# Patient Record
Sex: Female | Born: 1980
Health system: Southern US, Community
[De-identification: ages and names within clinical notes are randomized; demographics above are authoritative.]

## PROBLEM LIST (undated history)

## (undated) ENCOUNTER — Inpatient Hospital Stay (HOSPITAL_COMMUNITY): Payer: Self-pay

## (undated) DIAGNOSIS — R945 Abnormal results of liver function studies: Secondary | ICD-10-CM

## (undated) DIAGNOSIS — E7800 Pure hypercholesterolemia, unspecified: Secondary | ICD-10-CM

## (undated) DIAGNOSIS — R7989 Other specified abnormal findings of blood chemistry: Secondary | ICD-10-CM

---

## 2008-01-07 ENCOUNTER — Ambulatory Visit (HOSPITAL_COMMUNITY): Admission: RE | Admit: 2008-01-07 | Discharge: 2008-01-07 | Payer: Self-pay | Admitting: Family Medicine

## 2008-03-18 ENCOUNTER — Inpatient Hospital Stay (HOSPITAL_COMMUNITY): Admission: AD | Admit: 2008-03-18 | Discharge: 2008-03-18 | Payer: Self-pay | Admitting: Obstetrics & Gynecology

## 2008-03-18 ENCOUNTER — Ambulatory Visit: Payer: Self-pay | Admitting: Obstetrics and Gynecology

## 2008-03-22 ENCOUNTER — Inpatient Hospital Stay (HOSPITAL_COMMUNITY): Admission: AD | Admit: 2008-03-22 | Discharge: 2008-03-22 | Payer: Self-pay | Admitting: Obstetrics & Gynecology

## 2008-03-22 ENCOUNTER — Ambulatory Visit: Payer: Self-pay | Admitting: Obstetrics and Gynecology

## 2008-04-15 ENCOUNTER — Inpatient Hospital Stay (HOSPITAL_COMMUNITY): Admission: AD | Admit: 2008-04-15 | Discharge: 2008-04-19 | Payer: Self-pay | Admitting: Obstetrics & Gynecology

## 2008-04-15 ENCOUNTER — Ambulatory Visit: Payer: Self-pay | Admitting: Family

## 2008-11-17 ENCOUNTER — Encounter: Admission: RE | Admit: 2008-11-17 | Discharge: 2008-11-17 | Payer: Self-pay | Admitting: Internal Medicine

## 2010-07-17 LAB — CBC
HCT: 33.3 % — ABNORMAL LOW (ref 36.0–46.0)
MCHC: 34.3 g/dL (ref 30.0–36.0)
MCHC: 34.4 g/dL (ref 30.0–36.0)
MCV: 89.2 fL (ref 78.0–100.0)
Platelets: 223 10*3/uL (ref 150–400)
Platelets: 188 10*3/uL (ref 150–400)
RBC: 2.99 MIL/uL — ABNORMAL LOW (ref 3.87–5.11)
WBC: 11.3 10*3/uL — ABNORMAL HIGH (ref 4.0–10.5)
WBC: 18 10*3/uL — ABNORMAL HIGH (ref 4.0–10.5)

## 2011-12-06 ENCOUNTER — Other Ambulatory Visit (HOSPITAL_COMMUNITY)
Admission: RE | Admit: 2011-12-06 | Discharge: 2011-12-06 | Disposition: A | Discharge status: Home / Self Care | Payer: Commercial Indemnity | Source: Ambulatory Visit | Attending: Family Medicine | Admitting: Family Medicine

## 2011-12-06 DIAGNOSIS — Z124 Encounter for screening for malignant neoplasm of cervix: Secondary | ICD-10-CM | POA: Insufficient documentation

## 2011-12-06 DIAGNOSIS — Z1151 Encounter for screening for human papillomavirus (HPV): Secondary | ICD-10-CM | POA: Insufficient documentation

## 2011-12-06 DIAGNOSIS — R87810 Cervical high risk human papillomavirus (HPV) DNA test positive: Secondary | ICD-10-CM | POA: Insufficient documentation

## 2012-03-12 ENCOUNTER — Ambulatory Visit (INDEPENDENT_AMBULATORY_CARE_PROVIDER_SITE_OTHER): Payer: Managed Care, Other (non HMO) | Admitting: Physician Assistant

## 2012-03-12 VITALS — BP 134/76 | HR 93 | Temp 98.0°F | Resp 17 | Ht 63.0 in | Wt 177.0 lb

## 2012-03-12 DIAGNOSIS — N926 Irregular menstruation, unspecified: Secondary | ICD-10-CM

## 2012-03-12 LAB — POCT URINE PREGNANCY: Preg Test, Ur: POSITIVE

## 2012-03-12 NOTE — Patient Instructions (Signed)
LNMP 11/22/11 - estimated due date 08/28/12 Follow up with Obstetrician Continue prenatal vitamins Smoking cessation encouraged.

## 2012-03-12 NOTE — Progress Notes (Signed)
  Subjective:    Patient ID: Diamond Austin, female    DOB: [birth year], 31 y.o.   MRN: [id number]  HPI 31 year old female presents for pregnancy test. She needs this before she can be seen by the [facility].  LNMP 11/22/11. She did have some spotting at the beginning of September but states this was not her normal period.  Has had some lower abdominal pressure, but no pain, discharge, or gush of fluids.  Has had some nausea but no emesis.  This is her second pregnancy, her first child is 3 1/2.  She does continue to smoke, but is trying to quit.      Review of Systems  Constitutional: Negative for fever and chills.  Respiratory: Negative for cough.   Cardiovascular: Negative for chest pain.  Gastrointestinal: Positive for nausea and abdominal pain (lower, pressure). Negative for vomiting.  Genitourinary: Negative for dysuria, vaginal discharge and vaginal pain.  All other systems reviewed and are negative.       Objective:   Physical Exam  Constitutional: She is oriented to person, place, and time. She appears well-developed and well-nourished.  HENT:  Head: Normocephalic and atraumatic.  Right Ear: External ear normal.  Left Ear: External ear normal.  Eyes: Conjunctivae normal are normal.  Neck: Normal range of motion.  Cardiovascular: Normal rate.   Pulmonary/Chest: Effort normal.  Neurological: She is alert and oriented to person, place, and time.  Psychiatric: She has a normal mood and affect. Her behavior is normal. Judgment and thought content normal.      Results for orders placed in visit on 03/12/12  POCT URINE PREGNANCY      Component Value Range   Preg Test, Ur Positive         Assessment & Plan:   1. Menstrual irregularity  POCT urine pregnancy  Encouraged smoking cessation Follow up with obstetrics

## 2012-04-02 NOTE — L&D Delivery Note (Signed)
Delivery Note Pt pushed well and at 8:04 AM a viable female was delivered via Vaginal, Spontaneous Delivery (Presentation: OA  ).  APGAR: 7, 3; weight: pending.  Infant dried and placed on pt's abd initially; adequate vitals and crying. Shortly after was taken to warmer for eval and stimulation and a Code Apgar was called. Umbilical cord already drained of blood and placenta del by that time- unable to collect cord pH as initially was not indicated. Placenta status: Intact, Spontaneous.  Cord: 3 vessels   Anesthesia: Epidural  Episiotomy: None Lacerations: None Est. Blood Loss (mL): 200  Mom to postpartum.  Baby to nursery-stable.  Diamond Austin 08/20/2012, 8:26 AM

## 2012-06-09 ENCOUNTER — Inpatient Hospital Stay (HOSPITAL_COMMUNITY): Payer: Commercial Indemnity

## 2012-06-09 ENCOUNTER — Encounter (HOSPITAL_COMMUNITY): Payer: Self-pay | Admitting: *Deleted

## 2012-06-09 ENCOUNTER — Inpatient Hospital Stay (HOSPITAL_COMMUNITY)
Admission: AD | Admit: 2012-06-09 | Discharge: 2012-06-09 | Disposition: A | Discharge status: Home / Self Care | Payer: Commercial Indemnity | Source: Ambulatory Visit | Attending: Obstetrics & Gynecology | Admitting: Obstetrics & Gynecology

## 2012-06-09 DIAGNOSIS — R296 Repeated falls: Secondary | ICD-10-CM | POA: Insufficient documentation

## 2012-06-09 DIAGNOSIS — R109 Unspecified abdominal pain: Secondary | ICD-10-CM

## 2012-06-09 DIAGNOSIS — O99891 Other specified diseases and conditions complicating pregnancy: Secondary | ICD-10-CM

## 2012-06-09 DIAGNOSIS — O0930 Supervision of pregnancy with insufficient antenatal care, unspecified trimester: Secondary | ICD-10-CM | POA: Insufficient documentation

## 2012-06-09 DIAGNOSIS — O9A213 Injury, poisoning and certain other consequences of external causes complicating pregnancy, third trimester: Secondary | ICD-10-CM

## 2012-06-09 HISTORY — DX: Abnormal results of liver function studies: R94.5

## 2012-06-09 HISTORY — DX: Pure hypercholesterolemia, unspecified: E78.00

## 2012-06-09 HISTORY — DX: Other specified abnormal findings of blood chemistry: R79.89

## 2012-06-09 LAB — CBC
HCT: 31.4 % — ABNORMAL LOW (ref 36.0–46.0)
MCV: 83.5 fL (ref 78.0–100.0)
RBC: 3.76 MIL/uL — ABNORMAL LOW (ref 3.87–5.11)
WBC: 11.8 10*3/uL — ABNORMAL HIGH (ref 4.0–10.5)

## 2012-06-09 LAB — DIFFERENTIAL
Basophils Absolute: 0 10*3/uL (ref 0.0–0.1)
Eosinophils Absolute: 0.2 10*3/uL (ref 0.0–0.7)
Lymphs Abs: 2.1 10*3/uL (ref 0.7–4.0)
Neutro Abs: 8.6 10*3/uL — ABNORMAL HIGH (ref 1.7–7.7)
Neutrophils Relative %: 73 % (ref 43–77)

## 2012-06-09 LAB — TYPE AND SCREEN: Antibody Screen: NEGATIVE

## 2012-06-09 LAB — URINALYSIS, ROUTINE W REFLEX MICROSCOPIC
Bilirubin Urine: NEGATIVE
Glucose, UA: NEGATIVE mg/dL
Ketones, ur: NEGATIVE mg/dL
Nitrite: NEGATIVE
Urobilinogen, UA: 0.2 mg/dL (ref 0.0–1.0)

## 2012-06-09 NOTE — MAU Provider Note (Signed)
History     CSN: [id number]  Arrival date and time: 06/09/12 1531   None     No chief complaint on file.  HPI 32 y/o G2P1001 here after a fall 3 hours ago. States this afternoon she fell backwards out of a hammock onto her bottom. SHe began to have sharp upper and crampy lower abd pains about a 5/10 after that that have not changed since. She denies vaginal bleeding, LOF, discharge, irritation, and decreased FM. She denies fevers, chills, chest pain, dyspnea, and dysuria. She has some headaches normally which have not changed, she states they are worse with stress. She does not get any pre-natal care because of funding, she's been to [facility] urgent [facility] once, here once and is scheduled to go to the health dept next week.   OB History   Grav Para Term Preterm Abortions TAB SAB Ect Mult Living   2 1 1       1       Past Medical History  Diagnosis Date  . Hypercholesteremia   . Elevated liver function tests     2011    Past Surgical History  Procedure Laterality Date  . Vaginal delivery      X 1    Family History  Problem Relation Age of Onset  . Osteoporosis Mother   . Hypertension Father     History  Substance Use Topics  . Smoking status: Current Every Day Smoker -- 0.25 packs/day for 8 years    Types: Cigarettes  . Smokeless tobacco: Never Used  . Alcohol Use: No    Allergies: No Known Allergies  Prescriptions prior to admission  Medication Sig Dispense Refill  . acetaminophen (TYLENOL) 325 MG tablet Take 650 mg by mouth every 6 (six) hours as needed for pain (For headache.).      Marland Kitchen calcium carbonate (TUMS - DOSED IN MG ELEMENTAL CALCIUM) 500 MG chewable tablet Chew 1 tablet by mouth daily as needed for heartburn.      . Prenatal Vit-Fe Fumarate-FA (PRENATAL MULTIVITAMIN) TABS Take 1 tablet by mouth daily.        ROS Physical Exam   Blood pressure 92/48, pulse 81, temperature 98.6 F (37 C), temperature source Oral, resp. rate 18, height 5\' 3"  (1.6 m),  weight 85.73 kg (189 lb), last menstrual period 11/22/2011.  Physical Exam Gen: NAD, alert, cooperative with exam HEENT: NCAT, EOMI, PERRL CV: RRR, good S1/S2, no murmur Resp: CTABL, no wheezes, non-labored Abd: Soft nontender to palpation, pregnant abd Ext: No edema Neuro: Alert and oriented, No gross deficits  FHT: baseline 140, mod variability, accels present, no decels Toco: No contractions seen, some irritability  MAU Course  Procedures Results for orders placed during the hospital encounter of 06/09/12 (from the past 24 hour(s))  URINALYSIS, ROUTINE W REFLEX MICROSCOPIC     Status: Abnormal   Collection Time    06/09/12  3:50 PM      Result Value Range   Color, Urine YELLOW  YELLOW   APPearance CLEAR  CLEAR   Specific Gravity, Urine <1.005 (*) 1.005 - 1.030   pH 7.0  5.0 - 8.0   Glucose, UA NEGATIVE  NEGATIVE mg/dL   Hgb urine dipstick NEGATIVE  NEGATIVE   Bilirubin Urine NEGATIVE  NEGATIVE   Ketones, ur NEGATIVE  NEGATIVE mg/dL   Protein, ur NEGATIVE  NEGATIVE mg/dL   Urobilinogen, UA 0.2  0.0 - 1.0 mg/dL   Nitrite NEGATIVE  NEGATIVE   Leukocytes, UA NEGATIVE  NEGATIVE  CBC     Status: Abnormal   Collection Time    06/09/12  5:10 PM      Result Value Range   WBC 11.8 (*) 4.0 - 10.5 K/uL   RBC 3.76 (*) 3.87 - 5.11 MIL/uL   Hemoglobin 10.6 (*) 12.0 - 15.0 g/dL   HCT 16.1 (*) 09.6 - 04.5 %   MCV 83.5  78.0 - 100.0 fL   MCH 28.2  26.0 - 34.0 pg   MCHC 33.8  30.0 - 36.0 g/dL   RDW 40.9  81.1 - 91.4 %   Platelets 297  150 - 400 K/uL  DIFFERENTIAL     Status: Abnormal   Collection Time    06/09/12  5:10 PM      Result Value Range   Neutrophils Relative 73  43 - 77 %   Neutro Abs 8.6 (*) 1.7 - 7.7 K/uL   Lymphocytes Relative 18  12 - 46 %   Lymphs Abs 2.1  0.7 - 4.0 K/uL   Monocytes Relative 8  3 - 12 %   Monocytes Absolute 0.9  0.1 - 1.0 K/uL   Eosinophils Relative 1  0 - 5 %   Eosinophils Absolute 0.2  0.0 - 0.7 K/uL   Basophils Relative 0  0 - 1 %    Basophils Absolute 0.0  0.0 - 0.1 K/uL  TYPE AND SCREEN     Status: None   Collection Time    06/09/12  5:10 PM      Result Value Range   ABO/RH(D) A POS     Antibody Screen NEG     Sample Expiration 06/12/2012    RAPID HIV SCREEN ([facility])     Status: None   Collection Time    06/09/12  5:10 PM      Result Value Range   SUDS Rapid HIV Screen NON REACTIVE  NON REACTIVE    Assessment and Plan  32 y/o G2P1001 here after a fall - category 1 fetal strip with good variability - Limited US shows no placental abnormalities and 4 cm cervix - OB panel collected as she has no PNC, scheduled to go to [facility] next week - Trauma with no bad effect on fetus - DC home, f/u with HD as scheduled.   Kevin Fenton 06/09/2012, 7:50 PM

## 2012-06-09 NOTE — MAU Note (Signed)
Patient instructed to get dressed so she can be discharge. Patient left without signing discharge papers.

## 2012-06-09 NOTE — MAU Note (Addendum)
States she fell backwards out of a hammock around 1330. States she fell onto her backside. States she didn't think much about it, but about 2 hours later began to have some upper abdominal pressure and lower abdominal cramping. States is not constant, just "sporadic." States she has not received prenatal care. Was seen at a walk in clinic on [address] X 1. Had out-of pocket U/S in [place] at approx. 16 wks. ("Baby Bump").

## 2012-06-10 LAB — RPR: RPR Ser Ql: NONREACTIVE

## 2012-06-12 NOTE — MAU Provider Note (Signed)
I saw and examined patient along with student and agree with above note.   FRAZIER,NATALIE 06/12/2012 3:59 PM

## 2012-06-18 ENCOUNTER — Ambulatory Visit (HOSPITAL_COMMUNITY)
Admission: RE | Admit: 2012-06-18 | Discharge: 2012-06-18 | Disposition: A | Discharge status: Home / Self Care | Payer: Commercial Indemnity | Source: Ambulatory Visit | Attending: Obstetrics & Gynecology | Admitting: Obstetrics & Gynecology

## 2012-06-18 DIAGNOSIS — O99330 Smoking (tobacco) complicating pregnancy, unspecified trimester: Secondary | ICD-10-CM | POA: Insufficient documentation

## 2012-06-18 DIAGNOSIS — O358XX0 Maternal care for other (suspected) fetal abnormality and damage, not applicable or unspecified: Secondary | ICD-10-CM | POA: Insufficient documentation

## 2012-06-18 DIAGNOSIS — O9A213 Injury, poisoning and certain other consequences of external causes complicating pregnancy, third trimester: Secondary | ICD-10-CM

## 2012-06-18 DIAGNOSIS — Z1389 Encounter for screening for other disorder: Secondary | ICD-10-CM | POA: Insufficient documentation

## 2012-06-18 DIAGNOSIS — Z363 Encounter for antenatal screening for malformations: Secondary | ICD-10-CM | POA: Insufficient documentation

## 2012-07-29 ENCOUNTER — Encounter: Payer: Self-pay | Admitting: Obstetrics & Gynecology

## 2012-07-29 ENCOUNTER — Ambulatory Visit (INDEPENDENT_AMBULATORY_CARE_PROVIDER_SITE_OTHER): Payer: Commercial Indemnity | Admitting: Obstetrics & Gynecology

## 2012-07-29 ENCOUNTER — Other Ambulatory Visit: Payer: Self-pay | Admitting: Obstetrics & Gynecology

## 2012-07-29 VITALS — BP 112/77 | Temp 97.5°F | Wt 199.9 lb

## 2012-07-29 DIAGNOSIS — O0933 Supervision of pregnancy with insufficient antenatal care, third trimester: Secondary | ICD-10-CM

## 2012-07-29 DIAGNOSIS — O0930 Supervision of pregnancy with insufficient antenatal care, unspecified trimester: Secondary | ICD-10-CM

## 2012-07-29 LAB — POCT URINALYSIS DIP (DEVICE)
Hgb urine dipstick: NEGATIVE
Ketones, ur: NEGATIVE mg/dL
Leukocytes, UA: NEGATIVE
Nitrite: NEGATIVE
Protein, ur: NEGATIVE mg/dL
pH: 6 (ref 5.0–8.0)

## 2012-07-29 NOTE — Patient Instructions (Addendum)
Prenatal Care  WHAT IS PRENATAL CARE?  Prenatal care means health care during your pregnancy, before your baby is born. Take care of yourself and your baby by:   Getting early prenatal care. If you know you are pregnant, or think you might be pregnant, call your caregiver as soon as possible. Schedule a visit for a general/prenatal examination.  Getting regular prenatal care. Follow your caregiver's schedule for blood and other necessary tests. Do not miss appointments.  Do everything you can to keep yourself and your baby healthy during your pregnancy.  Prenatal care should include evaluation of medical, dietary, educational, psychological, and social needs for the couple and the medical, surgical, and genetic history of the family of the mother and father.  Discuss with your caregiver:  Your medicines, prescription, over-the-counter, and herbal medicines.  Substance abuse, alcohol, smoking, and illegal drugs.  Domestic abuse and violence, if present.  Your immunizations.  Nutrition and diet.  Exercising.  Environment and occupational hazards, at home and at work.  History of sexually transmitted disease, both you and your partner.  Previous pregnancies. WHY IS PRENATAL CARE SO IMPORTANT?  By seeing you regularly, your caregiver has the chance to find problems early, so that they can be treated as soon as possible. Other problems might be prevented. Many studies have shown that early and regular prenatal care is important for the health of both mothers and their babies.  I AM THINKING ABOUT GETTING PREGNANT. HOW CAN I TAKE CARE OF MYSELF?  Taking care of yourself before you get pregnant helps you to have a healthy pregnancy. It also lowers your chances of having a baby born with a birth defect. Here are ways to take care of yourself before you get pregnant:   Eat healthy foods, exercise regularly (30 minutes per day for most days of the week is best), and get enough rest and  sleep. Talk to your caregiver about what kinds of foods and exercises are best for you.  Take 400 micrograms (mcg) of folic acid (one of the B vitamins) every day. The best way to do this is to take a daily multivitamin pill that contains this amount of folic acid. Getting enough of the synthetic (manufactured) form of folic acid every day before you get pregnant and during early pregnancy can help prevent certain birth defects. Many breakfast cereals and other grain products have folic acid added to them, but only certain cereals contain 400 mcg of folic acid per serving. Check the label on your multivitamin or cereal to find the amount of folic acid in the food.  See your caregiver for a complete check up before getting pregnant. Make sure that you have had all your immunization shots, especially for rubella ([place] measles). Rubella can cause serious birth defects. Chickenpox is another illness you want to avoid during pregnancy. If you have had chickenpox and rubella in the past, you should be immune to them.  Tell your caregiver about any prescription or non-prescription medicines (including herbal remedies) you are taking. Some medicines are not safe to take during pregnancy.  Stop smoking cigarettes, drinking alcohol, or taking illegal drugs. Ask your caregiver for help, if you need it. You can also get help with alcohol and drugs by talking with a member of your faith community, a counselor, or a trusted friend.  Discuss and treat any medical, social, or psychological problems before getting pregnant.  Discuss any history of genetic problems in the mother, father, and their families. Do  genetic testing before getting pregnant, when possible.  Discuss any physical or emotional abuse with your caregiver.  Discuss with your caregiver if you might be exposed to harmful chemicals on your job or where you live.  Discuss with your caregiver if you think your job or the hours you work may be  harmful and should be changed.  The father should be involved with the decision making and with all aspects of the pregnancy, labor, and delivery.  If you have medical insurance, make sure you are covered for pregnancy. I JUST FOUND OUT THAT I AM PREGNANT. HOW CAN I TAKE CARE OF MYSELF?  Here are ways to take care of yourself and the precious new life growing inside you:   Continue taking your multivitamin with 400 micrograms (mcg) of folic acid every day.  Get early and regular prenatal care. It does not matter if this is your first pregnancy or if you already have children. It is very important to see a caregiver during your pregnancy. Your caregiver will check at each visit to make sure that you and the baby are healthy. If there are any problems, action can be taken right away to help you and the baby.  Eat a healthy diet that includes:  Fruits.  Vegetables.  Foods low in saturated fat.  Grains.  Calcium-rich foods.  Drink 6 to 8 glasses of liquids a day.  Unless your caregiver tells you not to, try to be physically active for 30 minutes, most days of the week. If you are pressed for time, you can get your activity in through 10 minute segments, three times a day.  If you smoke, drink alcohol, or use drugs, STOP. These can cause long-term damage to your baby. Talk with your caregiver about steps to take to stop smoking. Talk with a member of your faith community, a counselor, a trusted friend, or your caregiver if you are concerned about your alcohol or drug use.  Ask your caregiver before taking any medicine, even over-the-counter medicines. Some medicines are not safe to take during pregnancy.  Get plenty of rest and sleep.  Avoid hot tubs and saunas during pregnancy.  Do not have X-rays taken, unless absolutely necessary and with the recommendation of your caregiver. A lead shield can be placed on your abdomen, to protect the baby when X-rays are taken in other parts of the  body.  Do not empty the cat litter when you are pregnant. It may contain a parasite that causes an infection called toxoplasmosis, which can cause birth defects. Also, use gloves when working in garden areas used by cats.  Do not eat uncooked or undercooked cheese, meats, or fish.  Stay away from toxic chemicals like:  Insecticides.  Solvents (some cleaners or paint thinners).  Lead.  Mercury.  Sexual relations may continue until the end of the pregnancy, unless you have a medical problem or there is a problem with the pregnancy and your caregiver tells you not to.  Do not wear high heel shoes, especially during the second half of the pregnancy. You can lose your balance and fall.  Do not take long trips, unless absolutely necessary. Be sure to see your caregiver before going on the trip.  Do not sit in one position for more than 2 hours, when on a trip.  Take a copy of your medical records when going on a trip.  Know where there is a hospital in the city you are visiting, in case of an  emergency.  Most dangerous household products will have pregnancy warnings on their labels. Ask your caregiver about products if you are unsure.  Limit or eliminate your caffeine intake from coffee, tea, sodas, medicines, and chocolate.  Many women continue working through pregnancy. Staying active might help you stay healthier. If you have a question about the safety or the hours you work at your particular job, talk with your caregiver.  Get informed:  Read books.  Watch videos.  Go to childbirth classes for you and the father.  Talk with experienced moms.  Ask your caregiver about childbirth education classes for you and your partner. Classes can help you and your partner prepare for the birth of your baby.  Ask about a pediatrician (baby doctor) and methods and pain medicine for labor, delivery, and possible Cesarean delivery (C-section). I AM NOT THINKING ABOUT GETTING PREGNANT  RIGHT NOW, BUT HEARD THAT ALL WOMEN SHOULD TAKE FOLIC ACID EVERY DAY?  All women of childbearing age, with even a remote chance of getting pregnant, should try to make sure they get enough folic acid. Many pregnancies are not planned. Many women do not know they are actually pregnant early in their pregnancies, and certain birth defects happen in the very early part of pregnancy. Taking 400 micrograms (mcg) of folic acid every day will help prevent certain birth defects that happen in the early part of pregnancy. If a woman begins taking vitamin pills in the second or third month of pregnancy, it may be too late to prevent birth defects. Folic acid may also have other health benefits for women, besides preventing birth defects.  HOW OFTEN SHOULD I SEE MY CAREGIVER DURING PREGNANCY?  Your caregiver will give you a schedule for your prenatal visits. You will have visits more often as you get closer to the end of your pregnancy. An average pregnancy lasts about 40 weeks.  A typical schedule includes visiting your caregiver:   About once each month, during your first 6 months of pregnancy.  Every 2 weeks, during the next 2 months.  Weekly in the last month, until the delivery date. Your caregiver will probably want to see you more often if:  You are over 35.  Your pregnancy is high risk, because you have certain health problems or problems with the pregnancy, such as:  Diabetes.  High blood pressure.  The baby is not growing on schedule, according to the dates of the pregnancy. Your caregiver will do special tests, to make sure you and the baby are not having any serious problems. WHAT HAPPENS DURING PRENATAL VISITS?   At your first prenatal visit, your caregiver will talk to you about you and your partner's health history and your family's health history, and will do a physical exam.  On your first visit, a physical exam will include checks of your blood pressure, height and weight, and an  exam of your pelvic organs. Your caregiver will do a Pap test if you have not had one recently, and will do cultures of your cervix to make sure there is no infection.  At each visit, there will be tests of your blood, urine, blood pressure, weight, and checking the progress of the baby.  Your caregiver will be able to tell you when to expect that your baby will be born.  Each visit is also a chance for you to learn about staying healthy during pregnancy and for asking questions.  Discuss whether you will be breastfeeding.  At your later prenatal  visits, your caregiver will check how you are doing and how the baby is developing. You may have a number of tests done as your pregnancy progresses.  Ultrasound exams are often used to check on the baby's growth and health.  You may have more urine and blood tests, as well as special tests, if needed. These may include amniocentesis (examine fluid in the pregnancy sac), stress tests (check how baby responds to contractions), biophysical profile (measures fetus well-being). Your caregiver will explain the tests and why they are necessary. I AM IN MY LATE THIRTIES, AND I WANT TO HAVE A CHILD NOW. SHOULD I DO ANYTHING SPECIAL?  As you get older, there is more chance of having a medical problem (high blood pressure), pregnancy problem (preeclampsia, problems with the placenta), miscarriage, or a baby born with a birth defect. However, most women in their late thirties and early forties have healthy babies. See your caregiver on a regular basis before you get pregnant and be sure to go for exams throughout your pregnancy. Your caregiver probably will want to do some special tests to check on you and your baby's health when you are pregnant.  Women today are often delaying having children until later in life, when they are in their thirties and forties. While many women in their thirties and forties have no difficulty getting pregnant, fertility does decline  with age. For women over 40 who cannot get pregnant after 6 months of trying, it is recommended that they see their caregiver for a fertility evaluation. It is not uncommon to have trouble becoming pregnant or experience infertility (inability to become pregnant after trying for one year). If you think that you or your partner may be infertile, you can discuss this with your caregiver. He or she can recommend treatments such as drugs, surgery, or assisted reproductive technology.  Document Released: 03/22/2003 Document Revised: 06/11/2011 Document Reviewed: 02/16/2009 [facility] Patient Information 2013 [place], [place]. Glucose Tolerance Test During Pregnancy The glucose tolerance test (GTT) or 3-hour glucose test can be used to determine if a woman has diabetes that first begins or is first recognized during pregnancy (gestational diabetes). Typically, a GTT is done after you have had a 1-hour glucose test with results that indicate you possibly have gestational diabetes.  The test takes about 3 hours. There will be a series of blood tests after you drink the sugar water solution. You must remain at the testing location to make sure that your blood is drawn on time.  LET YOUR CAREGIVER KNOW ABOUT:  Allergies to food or medicine.  Medicines taken, including vitamins, herbs, eyedrops, over-the-counter medicines, and creams.  Any recent illnesses or infections. BEFORE THE PROCEDURE The GTT is a fasting test, meaning you must stop eating for a certain amount of time. The test will be the most accurate if you have not eaten for 8 12 hours before the test. For this reason, it is recommended that you have this test done in the morning before you have breakfast. PROCEDURE  Do not eat or drink anything but water during the test. When you arrive at the lab, a sample of your blood is taken to get your fasting blood glucose level. After your fasting glucose level is determined, you will be given a sugar water  solution to drink. You will be asked to wait in a certain area until your next blood test. The blood tests are done each hour for 3 hours. Stay close to the lab so your blood samples  can be taken on time. This is important. If the blood samples are not taken on time, the test will need to be done again on another day.  AFTER THE PROCEDURE  You can eat and drink as usual.   Ask when your test results will be ready. Make sure you get your test results. A positive test is considered when two of the four blood test values are equal or above the normal blood glucose level. Document Released: 09/18/2011 Document Reviewed: 07/12/2011 [facility] Patient Information 2013 [place], [place].

## 2012-07-29 NOTE — Progress Notes (Signed)
Patient c/o leg cramps. 

## 2012-07-30 DIAGNOSIS — O0930 Supervision of pregnancy with insufficient antenatal care, unspecified trimester: Secondary | ICD-10-CM | POA: Insufficient documentation

## 2012-07-30 LAB — OBSTETRIC PANEL
Basophils Absolute: 0 10*3/uL (ref 0.0–0.1)
Basophils Relative: 0 % (ref 0–1)
Hepatitis B Surface Ag: NEGATIVE
MCHC: 35.2 g/dL (ref 30.0–36.0)
Neutro Abs: 8.3 10*3/uL — ABNORMAL HIGH (ref 1.7–7.7)
Neutrophils Relative %: 75 % (ref 43–77)
Platelets: 294 10*3/uL (ref 150–400)
RDW: 14.6 % (ref 11.5–15.5)

## 2012-07-30 LAB — GLUCOSE TOLERANCE, 1 HOUR (50G) W/O FASTING: Glucose, 1 Hour GTT: 122 mg/dL (ref 70–140)

## 2012-07-31 LAB — HEMOGLOBINOPATHY EVALUATION
Hemoglobin Other: 0 %
Hgb A2 Quant: 3.1 % (ref 2.2–3.2)
Hgb F Quant: 0 % (ref 0.0–2.0)

## 2012-07-31 NOTE — Progress Notes (Signed)
Subjective:    Diamond Austin is being seen today for her first obstetrical visit.  This is a planned pregnancy. She is at [redacted]w[redacted]d gestation. Her obstetrical history is significant for smoker and stopped smokig during pregnancy.  Unsure if she plans to maintain.. Relationship with FOB: spouse, living together. Patient does intend to breast feed. Pregnancy history fully reviewed.  Menstrual History: OB History   Grav Para Term Preterm Abortions TAB SAB Ect Mult Living   2 1 1       1       Menarche age: 33  Patient's last menstrual period was 11/22/2011.    The following portions of the patient's history were reviewed and updated as appropriate: allergies, current medications, past family history, past medical history, past social history, past surgical history and problem list.  Review of Systems Pertinent items are noted in HPI.    Objective:    BP 112/77  Temp(Src) 97.5 F (36.4 C)  Wt 199 lb 14.4 oz (90.674 kg)  BMI 35.42 kg/m2  LMP 11/22/2011  General Appearance:    Alert, cooperative, no distress, appears stated age  Head:    Normocephalic, without obvious abnormality, atraumatic  Eyes:    PERRL, conjunctiva/corneas clear, EOM's intact, fundi    benign, both eyes  Ears:    Normal TM's and external ear canals, both ears  Nose:   Nares normal, septum midline, mucosa normal, no drainage    or sinus tenderness  Throat:   Lips, mucosa, and tongue normal; teeth and gums normal  Neck:   Supple, symmetrical, trachea midline, no adenopathy;    thyroid:  no enlargement/tenderness/nodules; no carotid   bruit or JVD  Back:     Symmetric, no curvature, ROM normal, no CVA tenderness  Lungs:     Clear to auscultation bilaterally, respirations unlabored  Chest Wall:    No tenderness or deformity   Heart:    Regular rate and rhythm, S1 and S2 normal, no murmur, rub   or gallop  Breast Exam:    No tenderness, masses, or nipple abnormality  Abdomen:     Soft, non-tender, bowel sounds  active all four quadrants,    no masses, no organomegaly  Genitalia:    Normal female without lesion, discharge or tenderness     Extremities:   Extremities normal, atraumatic, no cyanosis or edema  Pulses:   2+ and symmetric all extremities  Skin:   Skin color, texture, turgor normal, no rashes or lesions            Assessment:    Pregnancy at 36 and 5/7 weeks    Plan:    Initial labs drawn. Prenatal vitamins. Problem list reviewed and updated. AFP3 discussed: too late . Follow up in 1 weeks. F/u PAP and cx and GBS  1 hr GTT today

## 2012-08-04 ENCOUNTER — Ambulatory Visit (HOSPITAL_COMMUNITY): Payer: Commercial Indemnity

## 2012-08-05 ENCOUNTER — Ambulatory Visit (INDEPENDENT_AMBULATORY_CARE_PROVIDER_SITE_OTHER): Payer: Commercial Indemnity | Admitting: Obstetrics & Gynecology

## 2012-08-05 ENCOUNTER — Ambulatory Visit (HOSPITAL_COMMUNITY)
Admission: RE | Admit: 2012-08-05 | Discharge: 2012-08-05 | Disposition: A | Discharge status: Home / Self Care | Payer: Commercial Indemnity | Source: Ambulatory Visit | Attending: Obstetrics & Gynecology | Admitting: Obstetrics & Gynecology

## 2012-08-05 ENCOUNTER — Other Ambulatory Visit: Payer: Self-pay | Admitting: Obstetrics & Gynecology

## 2012-08-05 VITALS — BP 119/84 | Temp 97.9°F | Wt 199.0 lb

## 2012-08-05 DIAGNOSIS — O0933 Supervision of pregnancy with insufficient antenatal care, third trimester: Secondary | ICD-10-CM

## 2012-08-05 DIAGNOSIS — O0930 Supervision of pregnancy with insufficient antenatal care, unspecified trimester: Secondary | ICD-10-CM | POA: Insufficient documentation

## 2012-08-05 DIAGNOSIS — Z3689 Encounter for other specified antenatal screening: Secondary | ICD-10-CM | POA: Insufficient documentation

## 2012-08-05 DIAGNOSIS — O99330 Smoking (tobacco) complicating pregnancy, unspecified trimester: Secondary | ICD-10-CM | POA: Insufficient documentation

## 2012-08-05 LAB — POCT URINALYSIS DIP (DEVICE)
Leukocytes, UA: NEGATIVE
Protein, ur: NEGATIVE mg/dL
Specific Gravity, Urine: 1.02 (ref 1.005–1.030)
Urobilinogen, UA: 0.2 mg/dL (ref 0.0–1.0)
pH: 7 (ref 5.0–8.0)

## 2012-08-05 NOTE — Progress Notes (Signed)
[place] is today

## 2012-08-05 NOTE — Patient Instructions (Signed)
Normal Labor and Delivery  Your caregiver must first be sure you are in labor. Signs of labor include:  · You may pass what is called "the mucus plug" before labor begins. This is a small amount of blood stained mucus.  · Regular uterine contractions.  · The time between contractions get closer together.  · The discomfort and pain gradually gets more intense.  · Pains are mostly located in the back.  · Pains get worse when walking.  · The cervix (the opening of the uterus becomes thinner (begins to efface) and opens up (dilates).  Once you are in labor and admitted into the hospital or care center, your caregiver will do the following:  · A complete physical examination.  · Check your vital signs (blood pressure, pulse, temperature and the fetal heart rate).  · Do a vaginal examination (using a sterile glove and lubricant) to determine:  · The position (presentation) of the baby (head [vertex] or buttock first).  · The level (station) of the baby's head in the birth canal.  · The effacement and dilatation of the cervix.  · You may have your pubic hair shaved and be given an enema depending on your caregiver and the circumstance.  · An electronic monitor is usually placed on your abdomen. The monitor follows the length and intensity of the contractions, as well as the baby's heart rate.  · Usually, your caregiver will insert an IV in your arm with a bottle of sugar water. This is done as a precaution so that medications can be given to you quickly during labor or delivery.  NORMAL LABOR AND DELIVERY IS DIVIDED UP INTO 3 STAGES:  First Stage  This is when regular contractions begin and the cervix begins to efface and dilate. This stage can last from 3 to 15 hours. The end of the first stage is when the cervix is 100% effaced and 10 centimeters dilated. Pain medications may be given by   · Injection (morphine, demerol, etc.)  · Regional anesthesia (spinal, caudal or epidural, anesthetics given in different locations of  the spine). Paracervical pain medication may be given, which is an injection of and anesthetic on each side of the cervix.  A pregnant woman may request to have "Natural Childbirth" which is not to have any medications or anesthesia during her labor and delivery.  Second Stage  This is when the baby comes down through the birth canal (vagina) and is born. This can take 1 to 4 hours. As the baby's head comes down through the birth canal, you may feel like you are going to have a bowel movement. You will get the urge to bear down and push until the baby is delivered. As the baby's head is being delivered, the caregiver will decide if an episiotomy (a cut in the perineum and vagina area) is needed to prevent tearing of the tissue in this area. The episiotomy is sewn up after the delivery of the baby and placenta. Sometimes a mask with nitrous oxide is given for the mother to breath during the delivery of the baby to help if there is too much pain. The end of Stage 2 is when the baby is fully delivered. Then when the umbilical cord stops pulsating it is clamped and cut.  Third Stage  The third stage begins after the baby is completely delivered and ends after the placenta (afterbirth) is delivered. This usually takes 5 to 30 minutes. After the placenta is delivered, a medication   is given either by intravenous or injection to help contract the uterus and prevent bleeding. The third stage is not painful and pain medication is usually not necessary. If an episiotomy was done, it is repaired at this time.  After the delivery, the mother is watched and monitored closely for 1 to 2 hours to make sure there is no postpartum bleeding (hemorrhage). If there is a lot of bleeding, medication is given to contract the uterus and stop the bleeding.  Document Released: 12/27/2007 Document Revised: 06/11/2011 Document Reviewed: 12/27/2007  Exit[employer]® Patient Information ©2013 [employer].

## 2012-08-06 ENCOUNTER — Encounter: Payer: Self-pay | Admitting: Obstetrics & Gynecology

## 2012-08-12 ENCOUNTER — Ambulatory Visit (INDEPENDENT_AMBULATORY_CARE_PROVIDER_SITE_OTHER): Payer: Commercial Indemnity | Admitting: Obstetrics & Gynecology

## 2012-08-12 ENCOUNTER — Encounter: Payer: Self-pay | Admitting: Obstetrics & Gynecology

## 2012-08-12 VITALS — BP 128/79 | Temp 97.5°F | Wt 202.5 lb

## 2012-08-12 DIAGNOSIS — O0930 Supervision of pregnancy with insufficient antenatal care, unspecified trimester: Secondary | ICD-10-CM

## 2012-08-12 LAB — POCT URINALYSIS DIP (DEVICE)
Glucose, UA: NEGATIVE mg/dL
Ketones, ur: NEGATIVE mg/dL
Leukocytes, UA: NEGATIVE
Specific Gravity, Urine: <=1.005 (ref 1.005–1.030)
Urobilinogen, UA: 0.2 mg/dL (ref 0.0–1.0)

## 2012-08-12 NOTE — Patient Instructions (Signed)
Fetal Movement Counts Patient Name: __________________________________________________ Patient Due Date: ____________________ Kick counts is highly recommended in high risk pregnancies, but it is a good idea for every pregnant woman to do. Start counting fetal movements at 28 weeks of the pregnancy. Fetal movements increase after eating a full meal or eating or drinking something sweet (the blood sugar is higher). It is also important to drink plenty of fluids (well hydrated) before doing the count. Lie on your left side because it helps with the circulation or you can sit in a comfortable chair with your arms over your belly (abdomen) with no distractions around you. DOING THE COUNT  Try to do the count the same time of day each time you do it.  Mark the day and time, then see how long it takes for you to feel 10 movements (kicks, flutters, swishes, rolls). You should have at least 10 movements within 2 hours. You will most likely feel 10 movements in much less than 2 hours. If you do not, wait an hour and count again. After a couple of days you will see a pattern.  What you are looking for is a change in the pattern or not enough counts in 2 hours. Is it taking longer in time to reach 10 movements? SEEK MEDICAL CARE IF:  You feel less than 10 counts in 2 hours. Tried twice.  No movement in one hour.  The pattern is changing or taking longer each day to reach 10 counts in 2 hours.  You feel the baby is not moving as it usually does. Date: ____________ Movements: ____________ Start time: ____________ Finish time: ____________  Date: ____________ Movements: ____________ Start time: ____________ Finish time: ____________ Date: ____________ Movements: ____________ Start time: ____________ Finish time: ____________ Date: ____________ Movements: ____________ Start time: ____________ Finish time: ____________ Date: ____________ Movements: ____________ Start time: ____________ Finish time:  ____________ Date: ____________ Movements: ____________ Start time: ____________ Finish time: ____________ Date: ____________ Movements: ____________ Start time: ____________ Finish time: ____________ Date: ____________ Movements: ____________ Start time: ____________ Finish time: ____________  Date: ____________ Movements: ____________ Start time: ____________ Finish time: ____________ Date: ____________ Movements: ____________ Start time: ____________ Finish time: ____________ Date: ____________ Movements: ____________ Start time: ____________ Finish time: ____________ Date: ____________ Movements: ____________ Start time: ____________ Finish time: ____________ Date: ____________ Movements: ____________ Start time: ____________ Finish time: ____________ Date: ____________ Movements: ____________ Start time: ____________ Finish time: ____________ Date: ____________ Movements: ____________ Start time: ____________ Finish time: ____________  Date: ____________ Movements: ____________ Start time: ____________ Finish time: ____________ Date: ____________ Movements: ____________ Start time: ____________ Finish time: ____________ Date: ____________ Movements: ____________ Start time: ____________ Finish time: ____________ Date: ____________ Movements: ____________ Start time: ____________ Finish time: ____________ Date: ____________ Movements: ____________ Start time: ____________ Finish time: ____________ Date: ____________ Movements: ____________ Start time: ____________ Finish time: ____________ Date: ____________ Movements: ____________ Start time: ____________ Finish time: ____________  Date: ____________ Movements: ____________ Start time: ____________ Finish time: ____________ Date: ____________ Movements: ____________ Start time: ____________ Finish time: ____________ Date: ____________ Movements: ____________ Start time: ____________ Finish time: ____________ Date: ____________ Movements:  ____________ Start time: ____________ Finish time: ____________ Date: ____________ Movements: ____________ Start time: ____________ Finish time: ____________ Date: ____________ Movements: ____________ Start time: ____________ Finish time: ____________ Date: ____________ Movements: ____________ Start time: ____________ Finish time: ____________  Date: ____________ Movements: ____________ Start time: ____________ Finish time: ____________ Date: ____________ Movements: ____________ Start time: ____________ Finish time: ____________ Date: ____________ Movements: ____________ Start time: ____________ Finish time: ____________ Date: ____________ Movements:   ____________ Start time: ____________ Finish time: ____________ Date: ____________ Movements: ____________ Start time: ____________ Finish time: ____________ Date: ____________ Movements: ____________ Start time: ____________ Finish time: ____________ Date: ____________ Movements: ____________ Start time: ____________ Finish time: ____________  Date: ____________ Movements: ____________ Start time: ____________ Finish time: ____________ Date: ____________ Movements: ____________ Start time: ____________ Finish time: ____________ Date: ____________ Movements: ____________ Start time: ____________ Finish time: ____________ Date: ____________ Movements: ____________ Start time: ____________ Finish time: ____________ Date: ____________ Movements: ____________ Start time: ____________ Finish time: ____________ Date: ____________ Movements: ____________ Start time: ____________ Finish time: ____________ Date: ____________ Movements: ____________ Start time: ____________ Finish time: ____________  Date: ____________ Movements: ____________ Start time: ____________ Finish time: ____________ Date: ____________ Movements: ____________ Start time: ____________ Finish time: ____________ Date: ____________ Movements: ____________ Start time: ____________ Finish  time: ____________ Date: ____________ Movements: ____________ Start time: ____________ Finish time: ____________ Date: ____________ Movements: ____________ Start time: ____________ Finish time: ____________ Date: ____________ Movements: ____________ Start time: ____________ Finish time: ____________ Date: ____________ Movements: ____________ Start time: ____________ Finish time: ____________  Date: ____________ Movements: ____________ Start time: ____________ Finish time: ____________ Date: ____________ Movements: ____________ Start time: ____________ Finish time: ____________ Date: ____________ Movements: ____________ Start time: ____________ Finish time: ____________ Date: ____________ Movements: ____________ Start time: ____________ Finish time: ____________ Date: ____________ Movements: ____________ Start time: ____________ Finish time: ____________ Date: ____________ Movements: ____________ Start time: ____________ Finish time: ____________ Document Released: 04/18/2006 Document Revised: 06/11/2011 Document Reviewed: 10/19/2008 Exit[employer] Patient Information 2013 [employer].  

## 2012-08-12 NOTE — Progress Notes (Signed)
Pulse- 80  Edema-hands  Pain-"when I walk the weight of the belly"

## 2012-08-12 NOTE — Progress Notes (Signed)
Pt with no complaints.  Reviewed dating.  EDC 08/28/2012 based on sure LMP.  +FM, No VB, No ctx, No LOF

## 2012-08-19 ENCOUNTER — Ambulatory Visit (INDEPENDENT_AMBULATORY_CARE_PROVIDER_SITE_OTHER): Payer: Commercial Indemnity | Admitting: Obstetrics & Gynecology

## 2012-08-19 ENCOUNTER — Encounter (HOSPITAL_COMMUNITY): Payer: Self-pay | Admitting: Obstetrics and Gynecology

## 2012-08-19 ENCOUNTER — Encounter: Payer: Self-pay | Admitting: Obstetrics & Gynecology

## 2012-08-19 ENCOUNTER — Inpatient Hospital Stay (HOSPITAL_COMMUNITY)
Admission: AD | Admit: 2012-08-19 | Discharge: 2012-08-21 | DRG: 775 | Disposition: A | Discharge status: Home / Self Care | Payer: Managed Care, Other (non HMO) | Source: Ambulatory Visit | Attending: Family Medicine | Admitting: Family Medicine

## 2012-08-19 VITALS — BP 125/79 | Temp 97.7°F | Wt 205.1 lb

## 2012-08-19 DIAGNOSIS — O4290 Premature rupture of membranes, unspecified as to length of time between rupture and onset of labor, unspecified weeks of gestation: Principal | ICD-10-CM | POA: Diagnosis present

## 2012-08-19 DIAGNOSIS — O42013 Preterm premature rupture of membranes, onset of labor within 24 hours of rupture, third trimester: Secondary | ICD-10-CM | POA: Diagnosis present

## 2012-08-19 DIAGNOSIS — O0930 Supervision of pregnancy with insufficient antenatal care, unspecified trimester: Secondary | ICD-10-CM

## 2012-08-19 LAB — POCT URINALYSIS DIP (DEVICE)
Bilirubin Urine: NEGATIVE
Glucose, UA: NEGATIVE mg/dL
Hgb urine dipstick: NEGATIVE
Nitrite: NEGATIVE
Urobilinogen, UA: 0.2 mg/dL (ref 0.0–1.0)

## 2012-08-19 LAB — POCT FERN TEST

## 2012-08-19 LAB — CBC
MCH: 28.9 pg (ref 26.0–34.0)
MCHC: 34.5 g/dL (ref 30.0–36.0)
MCV: 84 fL (ref 78.0–100.0)
Platelets: 254 10*3/uL (ref 150–400)
RDW: 13.9 % (ref 11.5–15.5)

## 2012-08-19 LAB — TYPE AND SCREEN

## 2012-08-19 LAB — OB RESULTS CONSOLE GBS: GBS: NEGATIVE

## 2012-08-19 MED ORDER — IBUPROFEN 600 MG PO TABS: 600.0000 mg | ORAL_TABLET | Freq: Four times a day (QID) | ORAL | Status: DC | PRN

## 2012-08-19 MED ORDER — LIDOCAINE HCL (PF) 1 % IJ SOLN
30.0000 mL | INTRAMUSCULAR | Status: DC | PRN
  Filled 2012-08-19 (×2): qty 30

## 2012-08-19 MED ORDER — FLEET ENEMA 7-19 GM/118ML RE ENEM: 1.0000 | ENEMA | RECTAL | Status: DC | PRN

## 2012-08-19 MED ORDER — OXYTOCIN 40 UNITS IN LACTATED RINGERS INFUSION - SIMPLE MED
62.5000 mL/h | INTRAVENOUS | Status: DC
  Administered 2012-08-20: 62.5 mL/h via INTRAVENOUS

## 2012-08-19 MED ORDER — OXYCODONE-ACETAMINOPHEN 5-325 MG PO TABS: 1.0000 | ORAL_TABLET | ORAL | Status: DC | PRN

## 2012-08-19 MED ORDER — OXYTOCIN 40 UNITS IN LACTATED RINGERS INFUSION - SIMPLE MED
1.0000 m[IU]/min | INTRAVENOUS | Status: DC
  Administered 2012-08-19: 2 m[IU]/min via INTRAVENOUS
  Filled 2012-08-19: qty 1000

## 2012-08-19 MED ORDER — ZOLPIDEM TARTRATE 5 MG PO TABS: 5.0000 mg | ORAL_TABLET | Freq: Every evening | ORAL | Status: DC | PRN

## 2012-08-19 MED ORDER — LACTATED RINGERS IV SOLN: 500.0000 mL | INTRAVENOUS | Status: DC | PRN

## 2012-08-19 MED ORDER — LACTATED RINGERS IV SOLN
INTRAVENOUS | Status: DC
  Administered 2012-08-19 – 2012-08-20 (×2): via INTRAVENOUS

## 2012-08-19 MED ORDER — OXYTOCIN BOLUS FROM INFUSION: 500.0000 mL | INTRAVENOUS | Status: DC

## 2012-08-19 MED ORDER — CITRIC ACID-SODIUM CITRATE 334-500 MG/5ML PO SOLN: 30.0000 mL | ORAL | Status: DC | PRN

## 2012-08-19 MED ORDER — TERBUTALINE SULFATE 1 MG/ML IJ SOLN: 0.2500 mg | Freq: Once | INTRAMUSCULAR | Status: AC | PRN

## 2012-08-19 MED ORDER — ACETAMINOPHEN 325 MG PO TABS
650.0000 mg | ORAL_TABLET | ORAL | Status: DC | PRN
  Administered 2012-08-19: 650 mg via ORAL
  Filled 2012-08-19: qty 2

## 2012-08-19 MED ORDER — ONDANSETRON HCL 4 MG/2ML IJ SOLN: 4.0000 mg | Freq: Four times a day (QID) | INTRAMUSCULAR | Status: DC | PRN

## 2012-08-19 NOTE — Patient Instructions (Signed)
Preeclampsia and Eclampsia Preeclampsia is a condition of high blood pressure during pregnancy. It can happen at 20 weeks or later in pregnancy. If high blood pressure occurs in the second half of pregnancy with no other symptoms, it is called gestational hypertension and goes away after the baby is born. If any of the symptoms listed below develop with gestational hypertension, it is then called preeclampsia. Eclampsia (convulsions) may follow preeclampsia. This is one of the reasons for regular prenatal checkups. Early diagnosis and treatment are very important to prevent eclampsia. CAUSES  There is no known cause of preeclampsia/eclampsia in pregnancy. There are several known conditions that may put the pregnant woman at risk, such as:  The first pregnancy.  Having preeclampsia in a past pregnancy.  Having lasting (chronic) high blood pressure.  Having multiples (twins, triplets).  Being age 35 or older.  African American ethnic background.  Having kidney disease or diabetes.  Medical conditions such as lupus or blood diseases.  Being overweight (obese). SYMPTOMS   High blood pressure.  Headaches.  Sudden weight gain.  Swelling of hands, face, legs, and feet.  Protein in the urine.  Feeling sick to your stomach (nauseous) and throwing up (vomiting).  Vision problems (blurred or double vision).  Numbness in the face, arms, legs, and feet.  Dizziness.  Slurred speech.  Preeclampsia can cause growth retardation in the fetus.  Separation (abruption) of the placenta.  Not enough fluid in the amniotic sac (oligohydramnios).  Sensitivity to bright lights.  Belly (abdominal) pain. DIAGNOSIS  If protein is found in the urine in the second half of pregnancy, this is considered preeclampsia. Other symptoms mentioned above may also be present. TREATMENT  It is necessary to treat this.  Your caregiver may prescribe bed rest early in this condition. Plenty of rest and  salt restriction may be all that is needed.  Medicines may be necessary to lower blood pressure if the condition does not respond to more conservative measures.  In more severe cases, hospitalization may be needed:  For treatment of blood pressure.  To control fluid retention.  To monitor the baby to see if the condition is causing harm to the baby.  Hospitalization is the best way to treat the first sign of preeclampsia. This is so the mother and baby can be watched closely and blood tests can be done effectively and correctly.  If the condition becomes severe, it may be necessary to induce labor or to remove the infant by surgical means (cesarean section). The best cure for preeclampsia/eclampsia is to deliver the baby. Preeclampsia and eclampsia involve risks to mother and infant. Your caregiver will discuss these risks with you. Together, you can work out the best possible approach to your problems. Make sure you keep your prenatal visits as scheduled. Not keeping appointments could result in a chronic or permanent injury, pain, disability to you, and death or injury to you or your unborn baby. If there is any problem keeping the appointment, you must call to reschedule. HOME CARE INSTRUCTIONS   Keep your prenatal appointments and tests as scheduled.  Tell your caregiver if you have any of the above risk factors.  Get plenty of rest and sleep.  Eat a balanced diet that is low in salt, and do not add salt to your food.  Avoid stressful situations.  Only take over-the-counter and prescriptions medicines for pain, discomfort, or fever as directed by your caregiver. SEEK IMMEDIATE MEDICAL CARE IF:   You develop severe swelling   anywhere in the body. This usually occurs in the legs.  You gain 5 lb/2.3 kg or more in a week.  You develop a severe headache, dizziness, problems with your vision, or confusion.  You have abdominal pain, nausea, or vomiting.  You have a seizure.  You  have trouble moving any part of your body, or you develop numbness or problems speaking.  You have bruising or abnormal bleeding from anywhere in the body.  You develop a stiff neck.  You pass out. MAKE SURE YOU:   Understand these instructions.  Will watch your condition.  Will get help right away if you are not doing well or get worse. Document Released: 03/16/2000 Document Revised: 06/11/2011 Document Reviewed: 10/31/2007 Exit[employer] Patient Information 2013 [employer].  

## 2012-08-19 NOTE — H&P (Signed)
Diamond Austin is a 32 y.o. female presenting for ROM. Maternal Medical History:  Reason for admission: Rupture of membranes.  Nausea. ROM at 2 pm today.  No contractions yet.  Contractions: Onset was 3-5 hours ago.   Frequency: irregular.   Perceived severity is mild.    Fetal activity: Perceived fetal activity is normal.   Last perceived fetal movement was within the past hour.    Prenatal complications: No bleeding or preterm labor.   Prenatal Complications - Diabetes: none.    OB History   Grav Para Term Preterm Abortions TAB SAB Ect Mult Living   2 1 1       1      Past Medical History  Diagnosis Date  . Hypercholesteremia   . Elevated liver function tests     2011   Past Surgical History  Procedure Laterality Date  . Vaginal delivery      X 1   Family History: family history includes Arthritis in her paternal grandmother; Hypertension in her brother and father; and Osteoporosis in her mother. Social History:  reports that she has been smoking Cigarettes.  She has a 2 pack-year smoking history. She has never used smokeless tobacco. She reports that she does not drink alcohol or use illicit drugs.   Prenatal Transfer Tool  Maternal Diabetes: No Genetic Screening: Declined too late Maternal Ultrasounds/Referrals: Normal Fetal Ultrasounds or other Referrals:  None Maternal Substance Abuse:  No Significant Maternal Medications:  None Significant Maternal Lab Results:  Lab values include: Group B Strep negative Other Comments:  None  Review of Systems  Constitutional: Negative for fever.  Respiratory: Negative for cough and sputum production.   Cardiovascular: Negative for chest pain.  Gastrointestinal: Negative for nausea and vomiting.  Genitourinary: Negative for dysuria.  Skin: Negative for rash.  Neurological: Negative for headaches.    Dilation: 2 Effacement (%): 50 Station: -3 Exam by:: Dr. Shawnie Pons Blood pressure 128/86, pulse 120, temperature 98.8  F (37.1 C), temperature source Oral, resp. rate 18, last menstrual period 11/22/2011. Maternal Exam:  Uterine Assessment: Contraction strength is mild.  Contraction frequency is irregular.   Abdomen: Fetal presentation: vertex  Introitus: Normal vulva. Normal vagina.  Ferning test: not done.  Nitrazine test: not done. Amniotic fluid character: clear. Grossly ruptured  Pelvis: adequate for delivery.   Cervix: Cervix evaluated by digital exam.   2/50/-3  Physical Exam  Vitals reviewed. Constitutional: She is oriented to person, place, and time. She appears well-developed and well-nourished.  HENT:  Head: Normocephalic and atraumatic.  Eyes: No scleral icterus.  Neck: Neck supple.  Cardiovascular: Normal rate and regular rhythm.   Respiratory: Effort normal and breath sounds normal.  GI: Soft. There is no tenderness.  gravid  Musculoskeletal: She exhibits no edema.  Neurological: She is alert and oriented to person, place, and time.  Skin: Skin is warm and dry. No rash noted.  Psychiatric: She has a normal mood and affect.    Prenatal labs: ABO, Rh: A/POS/-- (04/29 0926) Antibody: NEG (04/29 0926) Rubella: 8.05 (04/29 0926) RPR: NON REAC (04/29 0926)  HBsAg: NEGATIVE (04/29 0926)  HIV: NON REACTIVE (04/29 0926)  GBS: Negative (05/20 0000)   Assessment/Plan: Patient Active Problem List   Diagnosis Date Noted  . Preterm PROM w/onset labor within 24 hours rupture in 3rd trimester 08/19/2012  . Late prenatal care complicating pregnancy 07/30/2012   Admit with labor augmentation   Diamond Austin 08/19/2012, 7:27 PM

## 2012-08-19 NOTE — MAU Note (Signed)
Diamond Austin is here with complaints of questionable ROM and no fetal movement since 2:00 this afternoon. She called the clinic and the nurse told her to seek medical attention. She is [redacted]w[redacted]d, G2P1

## 2012-08-19 NOTE — Progress Notes (Signed)
Bilateral LE edema.  Symmetric. No HA, visual changes or RUQ pain.

## 2012-08-19 NOTE — Progress Notes (Signed)
Pulse- 80  Edema-feet/hands  Pain-"cramping like a period"

## 2012-08-20 ENCOUNTER — Inpatient Hospital Stay (HOSPITAL_COMMUNITY): Payer: Managed Care, Other (non HMO) | Admitting: Anesthesiology

## 2012-08-20 ENCOUNTER — Encounter (HOSPITAL_COMMUNITY): Payer: Self-pay | Admitting: Anesthesiology

## 2012-08-20 ENCOUNTER — Encounter (HOSPITAL_COMMUNITY): Payer: Self-pay | Admitting: *Deleted

## 2012-08-20 DIAGNOSIS — O4290 Premature rupture of membranes, unspecified as to length of time between rupture and onset of labor, unspecified weeks of gestation: Secondary | ICD-10-CM

## 2012-08-20 MED ORDER — ONDANSETRON HCL 4 MG PO TABS: 4.0000 mg | ORAL_TABLET | ORAL | Status: DC | PRN

## 2012-08-20 MED ORDER — SIMETHICONE 80 MG PO CHEW: 80.0000 mg | CHEWABLE_TABLET | ORAL | Status: DC | PRN

## 2012-08-20 MED ORDER — ZOLPIDEM TARTRATE 5 MG PO TABS: 5.0000 mg | ORAL_TABLET | Freq: Every evening | ORAL | Status: DC | PRN

## 2012-08-20 MED ORDER — LANOLIN HYDROUS EX OINT: TOPICAL_OINTMENT | CUTANEOUS | Status: DC | PRN

## 2012-08-20 MED ORDER — BENZOCAINE-MENTHOL 20-0.5 % EX AERO
1.0000 "application " | INHALATION_SPRAY | CUTANEOUS | Status: DC | PRN
  Administered 2012-08-20: 1 via TOPICAL
  Filled 2012-08-20: qty 56

## 2012-08-20 MED ORDER — LIDOCAINE HCL (PF) 1 % IJ SOLN
INTRAMUSCULAR | Status: DC | PRN
  Administered 2012-08-20 (×2): 5 mL

## 2012-08-20 MED ORDER — PNEUMOCOCCAL VAC POLYVALENT 25 MCG/0.5ML IJ INJ
0.5000 mL | INJECTION | INTRAMUSCULAR | Status: AC
  Administered 2012-08-21: 0.5 mL via INTRAMUSCULAR
  Filled 2012-08-20 (×2): qty 0.5

## 2012-08-20 MED ORDER — IBUPROFEN 600 MG PO TABS
600.0000 mg | ORAL_TABLET | Freq: Four times a day (QID) | ORAL | Status: DC
  Administered 2012-08-20 – 2012-08-21 (×5): 600 mg via ORAL
  Filled 2012-08-20 (×5): qty 1

## 2012-08-20 MED ORDER — EPHEDRINE 5 MG/ML INJ
10.0000 mg | INTRAVENOUS | Status: DC | PRN
  Filled 2012-08-20: qty 2

## 2012-08-20 MED ORDER — DIPHENHYDRAMINE HCL 50 MG/ML IJ SOLN: 12.5000 mg | INTRAMUSCULAR | Status: DC | PRN

## 2012-08-20 MED ORDER — TETANUS-DIPHTH-ACELL PERTUSSIS 5-2.5-18.5 LF-MCG/0.5 IM SUSP
0.5000 mL | Freq: Once | INTRAMUSCULAR | Status: AC
  Administered 2012-08-21: 0.5 mL via INTRAMUSCULAR
  Filled 2012-08-20: qty 0.5

## 2012-08-20 MED ORDER — ONDANSETRON HCL 4 MG/2ML IJ SOLN: 4.0000 mg | INTRAMUSCULAR | Status: DC | PRN

## 2012-08-20 MED ORDER — DIPHENHYDRAMINE HCL 25 MG PO CAPS: 25.0000 mg | ORAL_CAPSULE | Freq: Four times a day (QID) | ORAL | Status: DC | PRN

## 2012-08-20 MED ORDER — DIBUCAINE 1 % RE OINT: 1.0000 "application " | TOPICAL_OINTMENT | RECTAL | Status: DC | PRN

## 2012-08-20 MED ORDER — FENTANYL 2.5 MCG/ML BUPIVACAINE 1/10 % EPIDURAL INFUSION (WH - ANES)
14.0000 mL/h | INTRAMUSCULAR | Status: DC | PRN
  Administered 2012-08-20: 14 mL/h via EPIDURAL
  Filled 2012-08-20: qty 125

## 2012-08-20 MED ORDER — OXYCODONE-ACETAMINOPHEN 5-325 MG PO TABS: 1.0000 | ORAL_TABLET | ORAL | Status: DC | PRN

## 2012-08-20 MED ORDER — PHENYLEPHRINE 40 MCG/ML (10ML) SYRINGE FOR IV PUSH (FOR BLOOD PRESSURE SUPPORT)
80.0000 ug | PREFILLED_SYRINGE | INTRAVENOUS | Status: DC | PRN
  Filled 2012-08-20: qty 2

## 2012-08-20 MED ORDER — PRENATAL MULTIVITAMIN CH
1.0000 | ORAL_TABLET | Freq: Every day | ORAL | Status: DC
  Administered 2012-08-20 – 2012-08-21 (×2): 1 via ORAL
  Filled 2012-08-20 (×2): qty 1

## 2012-08-20 MED ORDER — LACTATED RINGERS IV SOLN
500.0000 mL | Freq: Once | INTRAVENOUS | Status: AC
  Administered 2012-08-20: 500 mL via INTRAVENOUS

## 2012-08-20 MED ORDER — PHENYLEPHRINE 40 MCG/ML (10ML) SYRINGE FOR IV PUSH (FOR BLOOD PRESSURE SUPPORT)
80.0000 ug | PREFILLED_SYRINGE | INTRAVENOUS | Status: DC | PRN
  Filled 2012-08-20: qty 5
  Filled 2012-08-20: qty 2

## 2012-08-20 MED ORDER — WITCH HAZEL-GLYCERIN EX PADS: 1.0000 "application " | MEDICATED_PAD | CUTANEOUS | Status: DC | PRN

## 2012-08-20 MED ORDER — FENTANYL CITRATE 0.05 MG/ML IJ SOLN
100.0000 ug | INTRAMUSCULAR | Status: DC | PRN
  Administered 2012-08-20: 100 ug via INTRAVENOUS
  Filled 2012-08-20: qty 2

## 2012-08-20 MED ORDER — SENNOSIDES-DOCUSATE SODIUM 8.6-50 MG PO TABS
2.0000 | ORAL_TABLET | Freq: Every day | ORAL | Status: DC
  Administered 2012-08-20: 2 via ORAL

## 2012-08-20 MED ORDER — EPHEDRINE 5 MG/ML INJ
10.0000 mg | INTRAVENOUS | Status: DC | PRN
  Filled 2012-08-20: qty 2
  Filled 2012-08-20: qty 4

## 2012-08-20 NOTE — Progress Notes (Signed)
Shaw CNM updated on FHR, UC pattern, and pt request for IV pain medicine.  Clelia Croft to put in orders for pain medication.

## 2012-08-20 NOTE — Anesthesia Procedure Notes (Signed)
Epidural Patient location during procedure: OB Start time: 08/20/2012 5:37 AM  Staffing Anesthesiologist: Angus Seller., Harrell Gave. Performed by: anesthesiologist   Preanesthetic Checklist Completed: patient identified, site marked, surgical consent, pre-op evaluation, timeout performed, IV checked, risks and benefits discussed and monitors and equipment checked  Epidural Patient position: sitting Prep: site prepped and draped and DuraPrep Patient monitoring: continuous pulse ox and blood pressure Approach: midline Injection technique: LOR air and LOR saline  Needle:  Needle type: Tuohy  Needle gauge: 17 G Needle length: 9 cm and 9 Needle insertion depth: 5 cm cm Catheter type: closed end flexible Catheter size: 19 Gauge Catheter at skin depth: 10 cm Test dose: negative  Assessment Events: blood not aspirated, injection not painful, no injection resistance, negative IV test and no paresthesia  Additional Notes Patient identified.  Risk benefits discussed including failed block, incomplete pain control, headache, nerve damage, paralysis, blood pressure changes, nausea, vomiting, reactions to medication both toxic or allergic, and postpartum back pain.  Patient expressed understanding and wished to proceed.  All questions were answered.  Sterile technique used throughout procedure and epidural site dressed with sterile barrier dressing. No paresthesia or other complications noted.The patient did not experience any signs of intravascular injection such as tinnitus or metallic taste in mouth nor signs of intrathecal spread such as rapid motor block. Please see nursing notes for vital signs.

## 2012-08-20 NOTE — Anesthesia Preprocedure Evaluation (Signed)
Anesthesia Evaluation  Patient identified by MRN, date of birth, ID band Patient awake    Reviewed: Allergy & Precautions, H&P , Patient's Chart, lab work & pertinent test results  Airway Mallampati: III TM Distance: >3 FB Neck ROM: full    Dental no notable dental hx.    Pulmonary neg pulmonary ROS,  breath sounds clear to auscultation  Pulmonary exam normal       Cardiovascular negative cardio ROS  Rhythm:regular Rate:Normal     Neuro/Psych negative neurological ROS  negative psych ROS   GI/Hepatic negative GI ROS, Neg liver ROS,   Endo/Other  negative endocrine ROS  Renal/GU negative Renal ROS     Musculoskeletal   Abdominal   Peds  Hematology negative hematology ROS (+)   Anesthesia Other Findings   Reproductive/Obstetrics (+) Pregnancy                           Anesthesia Physical Anesthesia Plan  ASA: II  Anesthesia Plan: Epidural   Post-op Pain Management:    Induction:   Airway Management Planned:   Additional Equipment:   Intra-op Plan:   Post-operative Plan:   Informed Consent: I have reviewed the patients History and Physical, chart, labs and discussed the procedure including the risks, benefits and alternatives for the proposed anesthesia with the patient or authorized representative who has indicated his/her understanding and acceptance.     Plan Discussed with:   Anesthesia Plan Comments:         Anesthesia Quick Evaluation  

## 2012-08-20 NOTE — Progress Notes (Signed)
Shaw CNM updated on SVE, FHR and pt feeling pressure.  Orders given to labor down and call for delivery.

## 2012-08-20 NOTE — Anesthesia Postprocedure Evaluation (Signed)
Anesthesia Post Note  Patient: [name] @[facility]   Procedure(s) Performed: CLE/C/S  Anesthesia type: Epidural  Patient location: Mother/Baby  Post pain: Pain level controlled  Post assessment: Post-op Vital signs reviewed  Last Vitals: BP 123/68  Pulse 69  Temp(Src) 37.1 C (Oral)  Resp 18  Ht 5\' 3"  (1.6 m)  Wt 205 lb (92.987 kg)  BMI 36.32 kg/m2  SpO2 99%  LMP 11/22/2011  Post vital signs: Reviewed  Level of consciousness: awake  Complications: No apparent anesthesia complications

## 2012-08-20 NOTE — Progress Notes (Signed)
Clelia Croft CNM updated on pt request for epidural.  Orders given to place order for epidural on maternal request.

## 2012-08-21 MED ORDER — IBUPROFEN 600 MG PO TABS: 600.0000 mg | ORAL_TABLET | Freq: Four times a day (QID) | ORAL | Status: DC

## 2012-08-21 NOTE — Discharge Summary (Signed)
Attestation of Attending Supervision of Advanced Practitioner (PA/CNM/NP): Evaluation and management procedures were performed by the Advanced Practitioner under my supervision and collaboration.  I have reviewed the Advanced Practitioner's note and chart, and I agree with the management and plan.  Annye Forrey, MD, FACOG Attending Obstetrician & Gynecologist Faculty Practice, [facility]  

## 2012-08-21 NOTE — Discharge Summary (Signed)
Obstetric Discharge Summary Reason for Admission: onset of labor Prenatal Procedures: none Intrapartum Procedures: spontaneous vaginal delivery Postpartum Procedures: none Complications-Operative and Postpartum: none  Diamond Austin is a 32 y.o. female [id number] who presented to the MAU with PROM at [redacted]w[redacted]d.  She denies dizziness upon standing.  She has been tolerating food and drink and has been making urine, bowel movements, and passing flatus regularly.  She plans to breast feed exclusively.  She is planning to start birth control, but has not yet made up her mind as to which method she will choose.  She plans to see the [facility] for her postpartum OBGYN needs.  She has an appointment with [facility] to discuss circumcision for her son.    Hemoglobin  Date Value Range Status  08/19/2012 12.1  12.0 - 15.0 g/dL Final     HCT  Date Value Range Status  08/19/2012 35.1* 36.0 - 46.0 % Final    Physical Exam:  General: alert, cooperative and no distress CVS: RRR, good S1 and S2, no RGM Lungs: CTA bilaterally, no wheeze, rales, or rhonchi ABD: Normoactive bowel sounds x4 quadrants, soft, non-tender, non-distended Lochia: appropriate Uterine Fundus: firm DVT Evaluation: No evidence of DVT seen on physical exam. Negative Homan's sign.  Discharge Diagnoses: Term Pregnancy-delivered  Discharge Information: Date: 08/21/2012 Activity: pelvic rest Diet: routine Medications: PNV, Ibuprofen, Colace and Iron Condition: stable Instructions: refer to practice specific booklet Discharge to: home   Newborn Data: Live born female  Birth Weight: 5 lb 14.2 oz (2670 g) APGAR: 7, 3  Home with mother.  Anna Genre 08/21/2012, 8:49 AM  Seen also by me Agree with note Wynelle Bourgeois CNM

## 2012-08-22 NOTE — Progress Notes (Signed)
Pt discharged before CSW could assess reason for LPNC @ 35 weeks. CSW will monitor drug screen & make a report if necessary.      

## 2012-08-26 ENCOUNTER — Encounter: Payer: Commercial Indemnity | Admitting: Obstetrics & Gynecology

## 2012-09-24 ENCOUNTER — Ambulatory Visit: Payer: Commercial Indemnity | Admitting: Advanced Practice Midwife

## 2012-09-26 ENCOUNTER — Ambulatory Visit (INDEPENDENT_AMBULATORY_CARE_PROVIDER_SITE_OTHER): Payer: Managed Care, Other (non HMO) | Admitting: Obstetrics and Gynecology

## 2012-09-26 ENCOUNTER — Encounter: Payer: Self-pay | Admitting: Obstetrics and Gynecology

## 2012-09-26 NOTE — Progress Notes (Signed)
  Subjective:     Diamond Austin is a 32 y.o. female who presents for a postpartum visit. She is 5 weeks postpartum following a spontaneous vaginal delivery. I have fully reviewed the prenatal and intrapartum course. The delivery was at 38.6 gestational weeks. Outcome: spontaneous vaginal delivery. Anesthesia: epidural. Postpartum course has been uncomplicated. Baby's course has been uncomplicaetd. Baby is feeding by breast. Bleeding no bleeding. Bowel function is normal. Bladder function is normal. Patient is not sexually active. Contraception method is none. Postpartum depression screening: negative.     Review of Systems A comprehensive review of systems was negative.   Objective:    BP 118/76  Pulse 72  Ht 5\' 3"  (1.6 m)  Wt 188 lb 6.4 oz (85.458 kg)  BMI 33.38 kg/m2  Breastfeeding? Yes  General:  alert, cooperative and no distress   Breasts:  inspection negative, no nipple discharge or bleeding, no masses or nodularity palpable  Lungs: clear to auscultation bilaterally  Heart:  regular rate and rhythm  Abdomen: soft, non-tender; bowel sounds normal; no masses,  no organomegaly   Vulva:  normal  Vagina: normal vagina, no discharge, exudate, lesion, or erythema  Cervix:  multiparous appearance  Corpus: normal size, contour, position, consistency, mobility, non-tender  Adnexa:  normal adnexa and no mass, fullness, tenderness  Rectal Exam: Not performed.        Assessment:     Normal postpartum exam. Pap smear not done at today's visit.   Plan:    1. Contraception: IUD Patient medically cleared to resume all activities of daily living and exercise program 2. RTC for annual in April 2015 3. Follow up in: a few days for IUD insertion or as needed.

## 2012-10-27 ENCOUNTER — Ambulatory Visit: Payer: Managed Care, Other (non HMO) | Admitting: Obstetrics and Gynecology

## 2014-02-01 ENCOUNTER — Encounter: Payer: Self-pay | Admitting: Obstetrics and Gynecology

## 2014-03-23 ENCOUNTER — Other Ambulatory Visit (HOSPITAL_COMMUNITY)
Admission: RE | Admit: 2014-03-23 | Discharge: 2014-03-23 | Disposition: A | Discharge status: Home / Self Care | Payer: BC Managed Care – PPO | Source: Ambulatory Visit | Attending: Family Medicine | Admitting: Family Medicine

## 2014-03-23 ENCOUNTER — Other Ambulatory Visit: Payer: Self-pay | Admitting: Family Medicine

## 2014-03-23 DIAGNOSIS — Z1151 Encounter for screening for human papillomavirus (HPV): Secondary | ICD-10-CM | POA: Diagnosis present

## 2014-03-23 DIAGNOSIS — Z124 Encounter for screening for malignant neoplasm of cervix: Secondary | ICD-10-CM | POA: Insufficient documentation

## 2014-03-24 LAB — CYTOLOGY - PAP

## 2014-09-21 LAB — OB RESULTS CONSOLE HIV ANTIBODY (ROUTINE TESTING): HIV: NONREACTIVE

## 2014-09-21 LAB — OB RESULTS CONSOLE HGB/HCT, BLOOD
HCT: 36 %
HEMOGLOBIN: 11.7 g/dL

## 2014-09-21 LAB — OB RESULTS CONSOLE PLATELET COUNT: PLATELETS: 315 10*3/uL

## 2014-09-21 LAB — OB RESULTS CONSOLE RPR: RPR: NONREACTIVE

## 2014-09-23 LAB — OB RESULTS CONSOLE GC/CHLAMYDIA
CHLAMYDIA, DNA PROBE: NEGATIVE
Gonorrhea: NEGATIVE

## 2014-10-27 ENCOUNTER — Encounter (HOSPITAL_COMMUNITY): Payer: Self-pay

## 2014-10-27 ENCOUNTER — Ambulatory Visit (HOSPITAL_COMMUNITY): Payer: BLUE CROSS/BLUE SHIELD

## 2014-10-27 ENCOUNTER — Ambulatory Visit (HOSPITAL_COMMUNITY)
Admission: RE | Admit: 2014-10-27 | Discharge: 2014-10-27 | Disposition: A | Discharge status: Home / Self Care | Payer: BLUE CROSS/BLUE SHIELD | Source: Ambulatory Visit | Attending: Physician Assistant | Admitting: Physician Assistant

## 2014-10-27 ENCOUNTER — Other Ambulatory Visit (HOSPITAL_COMMUNITY): Payer: Self-pay | Admitting: Physician Assistant

## 2014-10-27 DIAGNOSIS — Z3A19 19 weeks gestation of pregnancy: Secondary | ICD-10-CM | POA: Insufficient documentation

## 2014-10-27 DIAGNOSIS — O09529 Supervision of elderly multigravida, unspecified trimester: Secondary | ICD-10-CM | POA: Insufficient documentation

## 2014-10-27 DIAGNOSIS — Z3A00 Weeks of gestation of pregnancy not specified: Secondary | ICD-10-CM | POA: Diagnosis not present

## 2014-10-27 DIAGNOSIS — O09522 Supervision of elderly multigravida, second trimester: Secondary | ICD-10-CM | POA: Diagnosis present

## 2014-11-10 ENCOUNTER — Other Ambulatory Visit (HOSPITAL_COMMUNITY): Payer: Self-pay | Admitting: Physician Assistant

## 2014-11-10 DIAGNOSIS — Z3A19 19 weeks gestation of pregnancy: Secondary | ICD-10-CM

## 2014-11-10 DIAGNOSIS — O99212 Obesity complicating pregnancy, second trimester: Secondary | ICD-10-CM

## 2014-11-10 DIAGNOSIS — Z0489 Encounter for examination and observation for other specified reasons: Secondary | ICD-10-CM

## 2014-11-10 DIAGNOSIS — O09522 Supervision of elderly multigravida, second trimester: Secondary | ICD-10-CM

## 2014-11-10 DIAGNOSIS — IMO0002 Reserved for concepts with insufficient information to code with codable children: Secondary | ICD-10-CM

## 2014-11-24 ENCOUNTER — Ambulatory Visit (HOSPITAL_COMMUNITY)
Admission: RE | Admit: 2014-11-24 | Discharge: 2014-11-24 | Disposition: A | Discharge status: Home / Self Care | Payer: BLUE CROSS/BLUE SHIELD | Source: Ambulatory Visit | Attending: Physician Assistant | Admitting: Physician Assistant

## 2014-11-24 ENCOUNTER — Encounter (HOSPITAL_COMMUNITY): Payer: Self-pay

## 2014-11-24 ENCOUNTER — Other Ambulatory Visit (HOSPITAL_COMMUNITY): Payer: Self-pay | Admitting: *Deleted

## 2014-11-24 DIAGNOSIS — O4440 Low lying placenta NOS or without hemorrhage, unspecified trimester: Secondary | ICD-10-CM

## 2014-11-24 DIAGNOSIS — Z0489 Encounter for examination and observation for other specified reasons: Secondary | ICD-10-CM

## 2014-11-24 DIAGNOSIS — IMO0002 Reserved for concepts with insufficient information to code with codable children: Secondary | ICD-10-CM

## 2014-11-24 DIAGNOSIS — Z36 Encounter for antenatal screening of mother: Secondary | ICD-10-CM | POA: Insufficient documentation

## 2014-11-24 DIAGNOSIS — O09522 Supervision of elderly multigravida, second trimester: Secondary | ICD-10-CM

## 2014-11-24 DIAGNOSIS — O4402 Placenta previa specified as without hemorrhage, second trimester: Secondary | ICD-10-CM | POA: Insufficient documentation

## 2014-11-24 DIAGNOSIS — Z3A19 19 weeks gestation of pregnancy: Secondary | ICD-10-CM | POA: Insufficient documentation

## 2014-11-24 DIAGNOSIS — O99212 Obesity complicating pregnancy, second trimester: Secondary | ICD-10-CM | POA: Diagnosis not present

## 2014-11-24 NOTE — Progress Notes (Signed)
Appointment Date: 11/24/2014 DOB: April 26, 1980 Referring Provider: Kathyrn Lass, MD Attending: Dr. Jamey Reas  [name] and her husband, Diamond Austin, were seen for genetic counseling because of a maternal age of 34 y.o..     In summary:  Discussed AMA risks and reduction in risk from normal Quad and normal ultrasound  Offered additional screening (NIPS) and testing (amnio) - patient declined  They were counseled regarding maternal age and the association with risk for chromosome conditions due to nondisjunction with aging of the ova.   We reviewed chromosomes, nondisjunction, and the associated 1 in 141 risk for fetal aneuploidy related to a maternal age of 34 y.o. at [redacted]w[redacted]d gestation.  They were counseled that the risk for aneuploidy decreases as gestational age increases, accounting for those pregnancies which spontaneously abort.  We specifically discussed Down syndrome (trisomy 57), trisomies 59 and 56, and sex chromosome aneuploidies (47,XXX and 47,XXY) including the common features and prognoses of each.   We also reviewed the results of Diamond Austin's Quad screen and ultrasound.  We discussed that the Quad screen is able to adjust a person's baseline (age related) chance for specific chromosome conditions.  Based on this test, her chance for Down syndrome was reduced from her age related chance of 1 in 250 to 1 in 46; the Quad screen also reduced the chance for Trisomy 17 from her age related chance to less than 1 in 10000.   They were counseled that 50-80% of fetuses with Down syndrome and up to 90% of fetuses with trisomies 20 and 18, when well visualized, have detectable anomalies or soft markers by ultrasound.  We discussed that her ultrasound did not identify any fetal anomalies or soft markers of aneuploidy.    We reviewed the option of noninvasive prenatal screening (NIPS)/cell free DNA (cfDNA) testing.  They were counseled about the benefits and limitations of this option.  Specifically, we discussed the conditions for which the test screens, the detection rates, and false positive rates for each. They were also counseled regarding diagnostic testing via amniocentesis. We reviewed the approximate 1 in 220-254 risk for complications for amniocentesis, including spontaneous pregnancy loss.   After reviewing these results and the available options they expressed that they are not interested in pursuing diagnostic testing at this time or in the future, given the associated risk of complications.  They understand that ultrasound and Quad screen cannot rule out all birth defects or genetic syndromes.    Diamond Austin provided with written information regarding sickle cell anemia (SCA) including the carrier frequency and incidence in the Hispanic population, the availability of carrier testing and prenatal diagnosis if indicated.  In addition, we discussed that hemoglobinopathies are routinely screened for as part of the [place] newborn screening panel.  She declined hemoglobin electrophoresis today.  Both family histories were reviewed and found to be noncontributory for birth defects, intellectual disability, and known genetic conditions. Without further information regarding the provided family history, an accurate genetic risk cannot be calculated. Further genetic counseling is warranted if more information is obtained.  Diamond Austin denied exposure to environmental toxins or chemical agents. She denied the use of alcohol or street drugs.  She reports smoking about 6 cigarettes per day, she was encouraged in smoking cessation. She denied significant viral illnesses during the course of her pregnancy. Her medical and surgical histories were noncontributory.   I counseled this couple regarding the above risks and available options.  The approximate face-to-face time with the genetic counselor  was 40 minutes.  Cam Hai, MS,  Certified Genetic Counselor

## 2015-01-14 ENCOUNTER — Ambulatory Visit (HOSPITAL_COMMUNITY)
Admission: RE | Admit: 2015-01-14 | Discharge: 2015-01-14 | Disposition: A | Discharge status: Home / Self Care | Payer: BLUE CROSS/BLUE SHIELD | Source: Ambulatory Visit | Attending: Obstetrics and Gynecology | Admitting: Obstetrics and Gynecology

## 2015-01-14 ENCOUNTER — Other Ambulatory Visit (HOSPITAL_COMMUNITY): Payer: Self-pay | Admitting: Obstetrics and Gynecology

## 2015-01-14 DIAGNOSIS — Z36 Encounter for antenatal screening of mother: Secondary | ICD-10-CM | POA: Diagnosis not present

## 2015-01-14 DIAGNOSIS — IMO0002 Reserved for concepts with insufficient information to code with codable children: Secondary | ICD-10-CM

## 2015-01-14 DIAGNOSIS — O4440 Low lying placenta NOS or without hemorrhage, unspecified trimester: Secondary | ICD-10-CM | POA: Diagnosis not present

## 2015-01-14 DIAGNOSIS — Z0489 Encounter for examination and observation for other specified reasons: Secondary | ICD-10-CM

## 2015-02-03 ENCOUNTER — Ambulatory Visit (HOSPITAL_COMMUNITY): Payer: BLUE CROSS/BLUE SHIELD

## 2015-02-22 ENCOUNTER — Encounter (HOSPITAL_COMMUNITY): Payer: Self-pay | Admitting: *Deleted

## 2015-02-22 ENCOUNTER — Inpatient Hospital Stay (HOSPITAL_COMMUNITY)
Admission: AD | Admit: 2015-02-22 | Discharge: 2015-02-22 | Disposition: A | Discharge status: Home / Self Care | Payer: BLUE CROSS/BLUE SHIELD | Source: Ambulatory Visit | Attending: Obstetrics & Gynecology | Admitting: Obstetrics & Gynecology

## 2015-02-22 DIAGNOSIS — Z3A32 32 weeks gestation of pregnancy: Secondary | ICD-10-CM | POA: Diagnosis not present

## 2015-02-22 DIAGNOSIS — O09523 Supervision of elderly multigravida, third trimester: Secondary | ICD-10-CM

## 2015-02-22 DIAGNOSIS — W501XXA Accidental kick by another person, initial encounter: Secondary | ICD-10-CM | POA: Diagnosis not present

## 2015-02-22 DIAGNOSIS — O26893 Other specified pregnancy related conditions, third trimester: Secondary | ICD-10-CM | POA: Diagnosis present

## 2015-02-22 DIAGNOSIS — S3991XA Unspecified injury of abdomen, initial encounter: Secondary | ICD-10-CM
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
## 2015-02-22 NOTE — Discharge Instructions (Signed)
Vaginal Delivery During delivery, your health care provider will help you give birth to your baby. During a vaginal delivery, you will work to push the baby out of your vagina. However, before you can push your baby out, a few things need to happen. The opening of your uterus (cervix) has to soften, thin out, and open up (dilate) all the way to 10 cm. Also, your baby has to move down from the uterus into your vagina.  SIGNS OF LABOR  Your health care provider will first need to make sure you are in labor. Signs of labor include:   Passing what is called the mucous plug before labor begins. This is a small amount of blood-stained mucus.  Having regular, painful uterine contractions.   The time between contractions gets shorter.   The discomfort and pain gradually get more intense.  Contraction pains get worse when walking and do not go away when resting.   Your cervix becomes thinner (effacement) and dilates. BEFORE THE DELIVERY Once you are in labor and admitted into the hospital or care center, your health care provider may do the following:  1. Perform a complete physical exam. 2. Review any complications related to pregnancy or labor. 3. Check your blood pressure, pulse, temperature, and heart rate (vital signs).  4. Determine if, and when, the rupture of amniotic membranes occurred. 5. Do a vaginal exam (using a sterile glove and lubricant) to determine:  1. The position (presentation) of the baby. Is the baby's head presenting first (vertex) in the birth canal (vagina), or are the feet or buttocks first (breech)?  2. The level (station) of the baby's head within the birth canal.  3. The effacement and dilatation of the cervix.  6. An electronic fetal monitor is usually placed on your abdomen when you first arrive. This is used to monitor your contractions and the baby's heart rate. 1. When the monitor is on your abdomen (external fetal monitor), it can only pick up the  frequency and length of your contractions. It cannot tell the strength of your contractions. 2. If it becomes necessary for your health care provider to know exactly how strong your contractions are or to see exactly what the baby's heart rate is doing, an internal monitor may be inserted into your vagina and uterus. Your health care provider will discuss the benefits and risks of using an internal monitor and obtain your permission before inserting the device. 3. Continuous fetal monitoring may be needed if you have an epidural, are receiving certain medicines (such as oxytocin), or have pregnancy or labor complications. 7. An IV access tube may be placed into a vein in your arm to deliver fluids and medicines if necessary. THREE STAGES OF LABOR AND DELIVERY Normal labor and delivery is divided into three stages. First Stage This stage starts when you begin to contract regularly and your cervix begins to efface and dilate. It ends when your cervix is completely open (fully dilated). The first stage is the longest stage of labor and can last from 3 hours to 15 hours.  Several methods are available to help with labor pain. You and your health care provider will decide which option is best for you. Options include:   Opioid medicines. These are strong pain medicines that you can get through your IV tube or as a shot into your muscle. These medicines lessen pain but do not make it go away completely.  Epidural. A medicine is given through a thin tube  that is inserted in your back. The medicine numbs the lower part of your body and prevents any pain in that area.  Paracervical pain medicine. This is an injection of an anesthetic on each side of your cervix.   You may request natural childbirth, which does not involve the use of pain medicines or an epidural during labor and delivery. Instead, you will use other things, such as breathing exercises, to help cope with the pain. Second Stage The second  stage of labor begins when your cervix is fully dilated at 10 cm. It continues until you push your baby down through the birth canal and the baby is born. This stage can take only minutes or several hours.  The location of your baby's head as it moves through the birth canal is reported as a number called a station. If the baby's head has not started its descent, the station is described as being at minus 3 (-3). When your baby's head is at the zero station, it is at the middle of the birth canal and is engaged in the pelvis. The station of your baby helps indicate the progress of the second stage of labor.  When your baby is born, your health care provider may hold the baby with his or her head lowered to prevent amniotic fluid, mucus, and blood from getting into the baby's lungs. The baby's mouth and nose may be suctioned with a small bulb syringe to remove any additional fluid.  Your health care provider may then place the baby on your stomach. It is important to keep the baby from getting cold. To do this, the health care provider will dry the baby off, place the baby directly on your skin (with no blankets between you and the baby), and cover the baby with warm, dry blankets.   The umbilical cord is cut. Third Stage During the third stage of labor, your health care provider will deliver the placenta (afterbirth) and make sure your bleeding is under control. The delivery of the placenta usually takes about 5 minutes but can take up to 30 minutes. After the placenta is delivered, a medicine may be given either by IV or injection to help contract the uterus and control bleeding. If you are planning to breastfeed, you can try to do so now. After you deliver the placenta, your uterus should contract and get very firm. If your uterus does not remain firm, your health care provider will massage it. This is important because the contraction of the uterus helps cut off bleeding at the site where the placenta  was attached to your uterus. If your uterus does not contract properly and stay firm, you may continue to bleed heavily. If there is a lot of bleeding, medicines may be given to contract the uterus and stop the bleeding.    This information is not intended to replace advice given to you by your health care provider. Make sure you discuss any questions you have with your health care provider.   Document Released: 12/27/2007 Document Revised: 04/09/2014 Document Reviewed: 11/14/2011 Elsevier Interactive Patient Education 2016 [place]. Fetal Movement Counts Patient Name: __________________________________________________ Patient Due Date: ____________________ Performing a fetal movement count is highly recommended in high-risk pregnancies, but it is good for every pregnant woman to do. Your health care provider may ask you to start counting fetal movements at 28 weeks of the pregnancy. Fetal movements often increase:  After eating a full meal.  After physical activity.  After eating or  drinking something sweet or cold.  At rest. Pay attention to when you feel the baby is most active. This will help you notice a pattern of your baby's sleep and wake cycles and what factors contribute to an increase in fetal movement. It is important to perform a fetal movement count at the same time each day when your baby is normally most active.  HOW TO COUNT FETAL MOVEMENTS 8. Find a quiet and comfortable area to sit or lie down on your left side. Lying on your left side provides the best blood and oxygen circulation to your baby. 9. Write down the day and time on a sheet of paper or in a journal. 10. Start counting kicks, flutters, swishes, rolls, or jabs in a 2-hour period. You should feel at least 10 movements within 2 hours. 11. If you do not feel 10 movements in 2 hours, wait 2-3 hours and count again. Look for a change in the pattern or not enough counts in 2 hours. SEEK MEDICAL CARE IF:  You feel  less than 10 counts in 2 hours, tried twice.  There is no movement in over an hour.  The pattern is changing or taking longer each day to reach 10 counts in 2 hours.  You feel the baby is not moving as he or she usually does. Date: ____________ Movements: ____________ Start time: ____________ Elizebeth Koller time: ____________  Date: ____________ Movements: ____________ Start time: ____________ Elizebeth Koller time: ____________ Date: ____________ Movements: ____________ Start time: ____________ Elizebeth Koller time: ____________ Date: ____________ Movements: ____________ Start time: ____________ Elizebeth Koller time: ____________ Date: ____________ Movements: ____________ Start time: ____________ Elizebeth Koller time: ____________ Date: ____________ Movements: ____________ Start time: ____________ Elizebeth Koller time: ____________ Date: ____________ Movements: ____________ Start time: ____________ Elizebeth Koller time: ____________ Date: ____________ Movements: ____________ Start time: ____________ Elizebeth Koller time: ____________  Date: ____________ Movements: ____________ Start time: ____________ Elizebeth Koller time: ____________ Date: ____________ Movements: ____________ Start time: ____________ Elizebeth Koller time: ____________ Date: ____________ Movements: ____________ Start time: ____________ Elizebeth Koller time: ____________ Date: ____________ Movements: ____________ Start time: ____________ Elizebeth Koller time: ____________ Date: ____________ Movements: ____________ Start time: ____________ Elizebeth Koller time: ____________ Date: ____________ Movements: ____________ Start time: ____________ Elizebeth Koller time: ____________ Date: ____________ Movements: ____________ Start time: ____________ Elizebeth Koller time: ____________  Date: ____________ Movements: ____________ Start time: ____________ Elizebeth Koller time: ____________ Date: ____________ Movements: ____________ Start time: ____________ Elizebeth Koller time: ____________ Date: ____________ Movements: ____________ Start time: ____________ Elizebeth Koller time:  ____________ Date: ____________ Movements: ____________ Start time: ____________ Elizebeth Koller time: ____________ Date: ____________ Movements: ____________ Start time: ____________ Elizebeth Koller time: ____________ Date: ____________ Movements: ____________ Start time: ____________ Elizebeth Koller time: ____________ Date: ____________ Movements: ____________ Start time: ____________ Elizebeth Koller time: ____________  Date: ____________ Movements: ____________ Start time: ____________ Elizebeth Koller time: ____________ Date: ____________ Movements: ____________ Start time: ____________ Elizebeth Koller time: ____________ Date: ____________ Movements: ____________ Start time: ____________ Elizebeth Koller time: ____________ Date: ____________ Movements: ____________ Start time: ____________ Elizebeth Koller time: ____________ Date: ____________ Movements: ____________ Start time: ____________ Elizebeth Koller time: ____________ Date: ____________ Movements: ____________ Start time: ____________ Elizebeth Koller time: ____________ Date: ____________ Movements: ____________ Start time: ____________ Elizebeth Koller time: ____________  Date: ____________ Movements: ____________ Start time: ____________ Elizebeth Koller time: ____________ Date: ____________ Movements: ____________ Start time: ____________ Elizebeth Koller time: ____________ Date: ____________ Movements: ____________ Start time: ____________ Elizebeth Koller time: ____________ Date: ____________ Movements: ____________ Start time: ____________ Elizebeth Koller time: ____________ Date: ____________ Movements: ____________ Start time: ____________ Elizebeth Koller time: ____________ Date: ____________ Movements: ____________ Start time: ____________ Elizebeth Koller time: ____________ Date: ____________ Movements: ____________ Start time: ____________ Elizebeth Koller time: ____________  Date: ____________ Movements: ____________  Start time: ____________ Elizebeth Koller time: ____________ Date: ____________ Movements: ____________ Start time: ____________ Elizebeth Koller time: ____________ Date: ____________ Movements:  ____________ Start time: ____________ Elizebeth Koller time: ____________ Date: ____________ Movements: ____________ Start time: ____________ Elizebeth Koller time: ____________ Date: ____________ Movements: ____________ Start time: ____________ Elizebeth Koller time: ____________ Date: ____________ Movements: ____________ Start time: ____________ Elizebeth Koller time: ____________ Date: ____________ Movements: ____________ Start time: ____________ Elizebeth Koller time: ____________  Date: ____________ Movements: ____________ Start time: ____________ Elizebeth Koller time: ____________ Date: ____________ Movements: ____________ Start time: ____________ Elizebeth Koller time: ____________ Date: ____________ Movements: ____________ Start time: ____________ Elizebeth Koller time: ____________ Date: ____________ Movements: ____________ Start time: ____________ Elizebeth Koller time: ____________ Date: ____________ Movements: ____________ Start time: ____________ Elizebeth Koller time: ____________ Date: ____________ Movements: ____________ Start time: ____________ Elizebeth Koller time: ____________ Date: ____________ Movements: ____________ Start time: ____________ Elizebeth Koller time: ____________  Date: ____________ Movements: ____________ Start time: ____________ Elizebeth Koller time: ____________ Date: ____________ Movements: ____________ Start time: ____________ Elizebeth Koller time: ____________ Date: ____________ Movements: ____________ Start time: ____________ Elizebeth Koller time: ____________ Date: ____________ Movements: ____________ Start time: ____________ Elizebeth Koller time: ____________ Date: ____________ Movements: ____________ Start time: ____________ Elizebeth Koller time: ____________ Date: ____________ Movements: ____________ Start time: ____________ Elizebeth Koller time: ____________   This information is not intended to replace advice given to you by your health care provider. Make sure you discuss any questions you have with your health care provider.   Document Released: 04/18/2006 Document Revised: 04/09/2014 Document Reviewed:  01/14/2012 Elsevier Interactive Patient Education [employer].

## 2015-02-22 NOTE — MAU Note (Signed)
Urine in lab 

## 2015-03-23 LAB — OB RESULTS CONSOLE GBS: GBS: NEGATIVE

## 2015-03-24 ENCOUNTER — Telehealth: Payer: Self-pay

## 2015-03-24 ENCOUNTER — Other Ambulatory Visit: Payer: BLUE CROSS/BLUE SHIELD | Admitting: *Deleted

## 2015-03-24 ENCOUNTER — Encounter (HOSPITAL_COMMUNITY): Payer: Self-pay

## 2015-03-24 ENCOUNTER — Ambulatory Visit (HOSPITAL_COMMUNITY)
Admission: RE | Admit: 2015-03-24 | Discharge: 2015-03-24 | Disposition: A | Discharge status: Home / Self Care | Payer: BLUE CROSS/BLUE SHIELD | Source: Ambulatory Visit | Attending: Nurse Practitioner | Admitting: Nurse Practitioner

## 2015-03-24 ENCOUNTER — Other Ambulatory Visit (HOSPITAL_COMMUNITY): Payer: Self-pay | Admitting: Nurse Practitioner

## 2015-03-24 VITALS — BP 127/69 | HR 107

## 2015-03-24 DIAGNOSIS — O26843 Uterine size-date discrepancy, third trimester: Secondary | ICD-10-CM

## 2015-03-24 DIAGNOSIS — O99333 Smoking (tobacco) complicating pregnancy, third trimester: Secondary | ICD-10-CM | POA: Diagnosis not present

## 2015-03-24 DIAGNOSIS — O99213 Obesity complicating pregnancy, third trimester: Secondary | ICD-10-CM | POA: Insufficient documentation

## 2015-03-24 DIAGNOSIS — Z3A26 26 weeks gestation of pregnancy: Secondary | ICD-10-CM | POA: Insufficient documentation

## 2015-03-24 DIAGNOSIS — O09523 Supervision of elderly multigravida, third trimester: Secondary | ICD-10-CM | POA: Diagnosis present

## 2015-03-24 DIAGNOSIS — O133 Gestational [pregnancy-induced] hypertension without significant proteinuria, third trimester: Secondary | ICD-10-CM

## 2015-03-24 LAB — COMPREHENSIVE METABOLIC PANEL
ALBUMIN: 3.3 g/dL — AB (ref 3.6–5.1)
ALK PHOS: 185 U/L — AB (ref 33–115)
ALT: 30 U/L — AB (ref 6–29)
AST: 32 U/L — AB (ref 10–30)
BUN: 6 mg/dL — AB (ref 7–25)
CALCIUM: 9.3 mg/dL (ref 8.6–10.2)
CO2: 21 mmol/L (ref 20–31)
Chloride: 104 mmol/L (ref 98–110)
Creat: 0.59 mg/dL (ref 0.50–1.10)
Glucose, Bld: 97 mg/dL (ref 65–99)
Potassium: 4 mmol/L (ref 3.5–5.3)
Sodium: 136 mmol/L (ref 135–146)
Total Bilirubin: 0.2 mg/dL (ref 0.2–1.2)
Total Protein: 6.9 g/dL (ref 6.1–8.1)

## 2015-03-24 LAB — CBC
HEMATOCRIT: 33.4 % — AB (ref 36.0–46.0)
HEMOGLOBIN: 11.3 g/dL — AB (ref 12.0–15.0)
MCH: 28.6 pg (ref 26.0–34.0)
MCHC: 33.8 g/dL (ref 30.0–36.0)
MCV: 84.6 fL (ref 78.0–100.0)
MPV: 10.3 fL (ref 8.6–12.4)
Platelets: 333 10*3/uL (ref 150–400)
RBC: 3.95 MIL/uL (ref 3.87–5.11)
RDW: 13.8 % (ref 11.5–15.5)
WBC: 11.2 10*3/uL — AB (ref 4.0–10.5)

## 2015-03-24 NOTE — Progress Notes (Signed)
Pt was sent from t

## 2015-03-24 NOTE — Telephone Encounter (Signed)
[facility] and labs drawn. CMP/CBC, Creatine and Protein. Blood pressure was check 127/60 hr 109. Pt is currently scheduled for an U/S at 3:00pm today.

## 2015-03-25 LAB — PROTEIN / CREATININE RATIO, URINE
Creatinine, Urine: 49 mg/dL (ref 20–320)
Protein Creatinine Ratio: 102 mg/g creat (ref 21–161)
TOTAL PROTEIN, URINE: 5 mg/dL (ref 5–24)

## 2015-04-03 NOTE — L&D Delivery Note (Signed)
Patient is 35 y.o. [id number] [redacted]w[redacted]d admitted in early active labor. Augmented with pitocin and AROM and progressed well to completely dilated.   Delivery Note At 12:21 AM a viable female was delivered via Vaginal, Spontaneous Delivery (Presentation: Left Occiput Anterior).  APGAR: 8, 9; weight-pending  .   Placenta status: Intact, Spontaneous.  Cord: 3 vessels with the following complications: None.  Cord pH: not collected  Anesthesia: Epidural  Episiotomy: None Lacerations: None Suture Repair: na Est. Blood Loss (mL): 300  Mom to postpartum.  Baby to Couplet care / Skin to Skin.  Juanita Craver [facility] 04/13/2015, 12:48 AM

## 2015-04-10 ENCOUNTER — Inpatient Hospital Stay (HOSPITAL_COMMUNITY)
Admission: EM | Admit: 2015-04-10 | Discharge: 2015-04-10 | Disposition: A | Discharge status: Home / Self Care | Payer: BLUE CROSS/BLUE SHIELD | Source: Ambulatory Visit | Attending: Obstetrics and Gynecology | Admitting: Obstetrics and Gynecology

## 2015-04-10 ENCOUNTER — Encounter (HOSPITAL_COMMUNITY): Payer: Self-pay | Admitting: *Deleted

## 2015-04-10 DIAGNOSIS — E7800 Pure hypercholesterolemia, unspecified: Secondary | ICD-10-CM | POA: Insufficient documentation

## 2015-04-10 DIAGNOSIS — O368130 Decreased fetal movements, third trimester, not applicable or unspecified: Secondary | ICD-10-CM

## 2015-04-10 DIAGNOSIS — F17210 Nicotine dependence, cigarettes, uncomplicated: Secondary | ICD-10-CM | POA: Diagnosis not present

## 2015-04-10 DIAGNOSIS — Z3A39 39 weeks gestation of pregnancy: Secondary | ICD-10-CM | POA: Insufficient documentation

## 2015-04-10 LAB — URINALYSIS, ROUTINE W REFLEX MICROSCOPIC
BILIRUBIN URINE: NEGATIVE
GLUCOSE, UA: NEGATIVE mg/dL
HGB URINE DIPSTICK: NEGATIVE
Ketones, ur: NEGATIVE mg/dL
Leukocytes, UA: NEGATIVE
Nitrite: NEGATIVE
PH: 7.5 (ref 5.0–8.0)
Protein, ur: NEGATIVE mg/dL
SPECIFIC GRAVITY, URINE: 1.01 (ref 1.005–1.030)

## 2015-04-10 NOTE — MAU Provider Note (Signed)
History   G4P2012 at 39.2 in with decreased fetal movement since this a.m. Since arrival to unit pt is now feeling movement.  CSN: [id number]  Arrival date & time 04/10/15  1103   None     No chief complaint on file.   HPI  Past Medical History  Diagnosis Date  . Hypercholesteremia   . Elevated liver function tests     2011    Past Surgical History  Procedure Laterality Date  . Vaginal delivery      X 1    Family History  Problem Relation Age of Onset  . Osteoporosis Mother   . Hypertension Father   . Hypertension Brother   . Arthritis Paternal Grandmother     Social History  Substance Use Topics  . Smoking status: Current Every Day Smoker -- 0.25 packs/day for 8 years    Types: Cigarettes  . Smokeless tobacco: Never Used  . Alcohol Use: No    OB History    Gravida Para Term Preterm AB TAB SAB Ectopic Multiple Living   4 2 2  0 1 1 0 0 0 2      Review of Systems  Constitutional: Negative.   HENT: Negative.   Eyes: Negative.   Respiratory: Negative.   Cardiovascular: Negative.   Gastrointestinal: Negative.   Endocrine: Negative.   Genitourinary: Negative.   Musculoskeletal: Negative.   Skin: Negative.   Allergic/Immunologic: Negative.   Neurological: Negative.   Hematological: Negative.   Psychiatric/Behavioral: Negative.     Allergies  Review of patient's allergies indicates no known allergies.  Home Medications  No current outpatient prescriptions on file.  BP 127/82 mmHg  Pulse 118  Temp(Src) 98.2 F (36.8 C) (Oral)  Resp 16  Ht 5' 2.25" (1.581 m)  Wt 226 lb (102.513 kg)  BMI 41.01 kg/m2  LMP 06/14/2014  Physical Exam  Constitutional: She is oriented to person, place, and time. She appears well-developed and well-nourished.  HENT:  Head: Normocephalic.  Eyes: Pupils are equal, round, and reactive to light.  Neck: Normal range of motion.  Cardiovascular: Normal rate, regular rhythm, normal heart sounds and intact distal pulses.    Pulmonary/Chest: Effort normal and breath sounds normal.  Abdominal: Soft. Bowel sounds are normal.  Musculoskeletal: Normal range of motion.  Neurological: She is alert and oriented to person, place, and time. She has normal reflexes.  Skin: Skin is warm and dry.  Psychiatric: She has a normal mood and affect. Her behavior is normal. Judgment and thought content normal.    MAU Course  Procedures (including critical care time)  Labs Reviewed  URINALYSIS, [place] (NOT AT [facility])   No results found.   No diagnosis found.    MDM  GFM, reactive fetal tracing will d/c home

## 2015-04-10 NOTE — MAU Note (Signed)
Pt. States she felt baby move last night but got up and ate breakfast and has not felt baby move at all today. Pt. States her next OB appointment is Wednesday. Denies LOF or bleeding. Pt. States she has been having contractions that come and go.

## 2015-04-12 ENCOUNTER — Inpatient Hospital Stay (HOSPITAL_COMMUNITY)
Admission: AD | Admit: 2015-04-12 | Discharge: 2015-04-15 | DRG: 767 | Disposition: A | Discharge status: Home / Self Care | Payer: BLUE CROSS/BLUE SHIELD | Source: Ambulatory Visit | Attending: Obstetrics & Gynecology | Admitting: Obstetrics & Gynecology

## 2015-04-12 ENCOUNTER — Inpatient Hospital Stay (HOSPITAL_COMMUNITY): Payer: BLUE CROSS/BLUE SHIELD | Admitting: Anesthesiology

## 2015-04-12 ENCOUNTER — Encounter (HOSPITAL_COMMUNITY): Payer: Self-pay | Admitting: *Deleted

## 2015-04-12 DIAGNOSIS — F17210 Nicotine dependence, cigarettes, uncomplicated: Secondary | ICD-10-CM | POA: Diagnosis present

## 2015-04-12 DIAGNOSIS — Z3A39 39 weeks gestation of pregnancy: Secondary | ICD-10-CM

## 2015-04-12 DIAGNOSIS — O99214 Obesity complicating childbirth: Secondary | ICD-10-CM | POA: Diagnosis present

## 2015-04-12 DIAGNOSIS — IMO0001 Reserved for inherently not codable concepts without codable children: Secondary | ICD-10-CM

## 2015-04-12 DIAGNOSIS — Z6841 Body Mass Index (BMI) 40.0 and over, adult: Secondary | ICD-10-CM | POA: Diagnosis not present

## 2015-04-12 DIAGNOSIS — Z302 Encounter for sterilization: Secondary | ICD-10-CM | POA: Diagnosis not present

## 2015-04-12 DIAGNOSIS — Z8249 Family history of ischemic heart disease and other diseases of the circulatory system: Secondary | ICD-10-CM

## 2015-04-12 DIAGNOSIS — O99334 Smoking (tobacco) complicating childbirth: Secondary | ICD-10-CM | POA: Diagnosis present

## 2015-04-12 LAB — CBC
HCT: 32.9 % — ABNORMAL LOW (ref 36.0–46.0)
Hemoglobin: 11.3 g/dL — ABNORMAL LOW (ref 12.0–15.0)
MCH: 28.8 pg (ref 26.0–34.0)
MCHC: 34.3 g/dL (ref 30.0–36.0)
MCV: 83.9 fL (ref 78.0–100.0)
Platelets: 316 10*3/uL (ref 150–400)
RBC: 3.92 MIL/uL (ref 3.87–5.11)
RDW: 13.7 % (ref 11.5–15.5)
WBC: 11.8 10*3/uL — AB (ref 4.0–10.5)

## 2015-04-12 LAB — TYPE AND SCREEN
ABO/RH(D): A POS
ANTIBODY SCREEN: NEGATIVE

## 2015-04-12 MED ORDER — ACETAMINOPHEN 325 MG PO TABS
650.0000 mg | ORAL_TABLET | ORAL | Status: DC | PRN
Start: 2015-04-12 — End: 2015-04-13

## 2015-04-12 MED ORDER — LACTATED RINGERS IV SOLN: 500.0000 mL | INTRAVENOUS | Status: DC | PRN

## 2015-04-12 MED ORDER — FENTANYL CITRATE (PF) 100 MCG/2ML IJ SOLN: 100.0000 ug | INTRAMUSCULAR | Status: DC | PRN

## 2015-04-12 MED ORDER — OXYCODONE-ACETAMINOPHEN 5-325 MG PO TABS: 2.0000 | ORAL_TABLET | ORAL | Status: DC | PRN

## 2015-04-12 MED ORDER — ONDANSETRON HCL 4 MG/2ML IJ SOLN: 4.0000 mg | Freq: Four times a day (QID) | INTRAMUSCULAR | Status: DC | PRN

## 2015-04-12 MED ORDER — CITRIC ACID-SODIUM CITRATE 334-500 MG/5ML PO SOLN: 30.0000 mL | ORAL | Status: DC | PRN

## 2015-04-12 MED ORDER — LACTATED RINGERS IV SOLN
INTRAVENOUS | Status: DC
  Administered 2015-04-12: 1000 mL via INTRAVENOUS

## 2015-04-12 MED ORDER — OXYTOCIN BOLUS FROM INFUSION
500.0000 mL | INTRAVENOUS | Status: DC
  Administered 2015-04-13: 500 mL via INTRAVENOUS

## 2015-04-12 MED ORDER — TERBUTALINE SULFATE 1 MG/ML IJ SOLN
0.2500 mg | Freq: Once | INTRAMUSCULAR | Status: DC | PRN
  Filled 2015-04-12: qty 1

## 2015-04-12 MED ORDER — OXYCODONE-ACETAMINOPHEN 5-325 MG PO TABS: 1.0000 | ORAL_TABLET | ORAL | Status: DC | PRN

## 2015-04-12 MED ORDER — LIDOCAINE HCL (PF) 1 % IJ SOLN
INTRAMUSCULAR | Status: DC | PRN
  Administered 2015-04-12: 9 mL
  Administered 2015-04-12: 6 mL via EPIDURAL

## 2015-04-12 MED ORDER — PHENYLEPHRINE 40 MCG/ML (10ML) SYRINGE FOR IV PUSH (FOR BLOOD PRESSURE SUPPORT)
80.0000 ug | PREFILLED_SYRINGE | INTRAVENOUS | Status: DC | PRN
  Filled 2015-04-12: qty 20
  Filled 2015-04-12: qty 2

## 2015-04-12 MED ORDER — LIDOCAINE HCL (PF) 1 % IJ SOLN
30.0000 mL | INTRAMUSCULAR | Status: DC | PRN
  Filled 2015-04-12: qty 30

## 2015-04-12 MED ORDER — FENTANYL 2.5 MCG/ML BUPIVACAINE 1/10 % EPIDURAL INFUSION (WH - ANES)
14.0000 mL/h | INTRAMUSCULAR | Status: DC | PRN
  Administered 2015-04-12: 14 mL/h via EPIDURAL
  Filled 2015-04-12: qty 125

## 2015-04-12 MED ORDER — OXYTOCIN 10 UNIT/ML IJ SOLN: 2.5000 [IU]/h | INTRAVENOUS | Status: DC

## 2015-04-12 MED ORDER — DIPHENHYDRAMINE HCL 50 MG/ML IJ SOLN: 12.5000 mg | INTRAMUSCULAR | Status: DC | PRN

## 2015-04-12 MED ORDER — EPHEDRINE 5 MG/ML INJ
10.0000 mg | INTRAVENOUS | Status: DC | PRN
  Filled 2015-04-12: qty 2

## 2015-04-12 MED ORDER — OXYTOCIN 10 UNIT/ML IJ SOLN
1.0000 m[IU]/min | INTRAVENOUS | Status: DC
  Administered 2015-04-12: 2 m[IU]/min via INTRAVENOUS
  Filled 2015-04-12: qty 4

## 2015-04-12 NOTE — MAU Note (Signed)
Has been contracting since last night.  Have been q 10 for the last 2 hours.

## 2015-04-12 NOTE — Progress Notes (Signed)
Labor Progress Note  Diamond Austin is a 35 y.o. [phone] at [redacted]w[redacted]d  admitted for active labor  S:  Comfortable with epidural.   O:  BP 116/71 mmHg  Pulse 84  Temp(Src) 98.3 F (36.8 C) (Oral)  Resp 18  Ht 5\' 2"  (1.575 m)  Wt 102.513 kg (226 lb)  BMI 41.33 kg/m2  LMP 06/14/2014     FHT:  FHR: 145 bpm, variability: moderate,  accelerations:  Present,  decelerations:  None Contractions: every 2-4 minutes SVE:   Dilation: 6.5 Effacement (%): 90 Station: -2 Exam by:: Petra Kuba Tietje RN     Labs: Lab Results  Component Value Date   WBC 11.8* 04/12/2015   HGB 11.3* 04/12/2015   HCT 32.9* 04/12/2015   MCV 83.9 04/12/2015   PLT 316 04/12/2015    Assessment / Plan: 35 y.o. [id number] [redacted]w[redacted]d in active labor   Labor: SOL. Progressing with pitocin.  Fetal Wellbeing:  Cat I Pain Control:  Epidural in place Anticipated MOD:  NSVD  Expectant management   Dimas Chyle, MD

## 2015-04-12 NOTE — Progress Notes (Signed)
Labor Progress Note  Diamond Austin is a 35 y.o. [id number] at [redacted]w[redacted]d  admitted for active labor  S:  Patient doing well. Is doing okay with pain. Currently does not want medications for pain.  O:  BP 118/73 mmHg  Pulse 83  Temp(Src) 98.7 F (37.1 C) (Oral)  Resp 18  Ht 5\' 2"  (1.575 m)  Wt 102.513 kg (226 lb)  BMI 41.33 kg/m2  LMP 06/14/2014     FHT:  FHR: 135 bpm, variability: moderate,  accelerations:  Present,  decelerations:  Present one variable deceleration   Contractions: about every 8 mins  SVE:   Dilation: 5.5 Effacement (%): 90 Station: -2 Exam by:: Philis Pique, RN and Dr. Dallas Schimke Intact Membrane     Labs: Lab Results  Component Value Date   WBC 11.8* 04/12/2015   HGB 11.3* 04/12/2015   HCT 32.9* 04/12/2015   MCV 83.9 04/12/2015   PLT 316 04/12/2015    Assessment / Plan: 35 y.o. [id number] [redacted]w[redacted]d in active labor Due to lack of progression, treatment team discussed possible options with patient: to discharge home and return when contractions increase in intensity vs to start pitocin to augment labor. Patient would like to start pitocin to augment labor.   Labor: currenlty dilated >5.5 (no change since about 2pm). Will start pitocin  Fetal Wellbeing:  Category II; one variable deceleration, otherwise normal baseline with moderate variability and accelerations  Pain Control:  Labor support without medications Anticipated MOD:  NSVD  Expectant management   Smiley Houseman, MD PGY1 Family Medicine  OB fellow attestation:  I have seen and examined this patient; I agree with above documentation in the resident's note.   Caren Macadam, MD 8:48 PM

## 2015-04-12 NOTE — Anesthesia Procedure Notes (Signed)
Epidural Patient location during procedure: OB Start time: 04/12/2015 7:41 PM End time: 04/12/2015 7:45 PM  Staffing Anesthesiologist: Lyn Hollingshead Performed by: anesthesiologist   Preanesthetic Checklist Completed: patient identified, surgical consent, pre-op evaluation, timeout performed, IV checked, risks and benefits discussed and monitors and equipment checked  Epidural Patient position: sitting Prep: site prepped and draped and DuraPrep Patient monitoring: continuous pulse ox and blood pressure Approach: midline Location: L3-L4 Injection technique: LOR air  Needle:  Needle type: Tuohy  Needle gauge: 17 G Needle length: 9 cm and 9 Needle insertion depth: 6 cm Catheter type: closed end flexible Catheter size: 19 Gauge Catheter at skin depth: 11 cm Test dose: negative and Other  Assessment Sensory level: T9 Events: blood not aspirated, injection not painful, no injection resistance, negative IV test and no paresthesia  Additional Notes Reason for block:procedure for pain

## 2015-04-12 NOTE — Anesthesia Preprocedure Evaluation (Signed)
Anesthesia Evaluation  Patient identified by MRN, date of birth, ID band Patient awake    Reviewed: Allergy & Precautions, H&P , NPO status , Patient's Chart, lab work & pertinent test results  Airway Mallampati: II  TM Distance: >3 FB Neck ROM: full    Dental no notable dental hx.    Pulmonary Current Smoker,    Pulmonary exam normal        Cardiovascular negative cardio ROS Normal cardiovascular exam     Neuro/Psych negative neurological ROS  negative psych ROS   GI/Hepatic negative GI ROS, Neg liver ROS,   Endo/Other  Morbid obesity  Renal/GU negative Renal ROS     Musculoskeletal   Abdominal (+) + obese,   Peds  Hematology negative hematology ROS (+)   Anesthesia Other Findings   Reproductive/Obstetrics (+) Pregnancy                             Anesthesia Physical Anesthesia Plan  ASA: III  Anesthesia Plan: Epidural   Post-op Pain Management:    Induction:   Airway Management Planned:   Additional Equipment:   Intra-op Plan:   Post-operative Plan:   Informed Consent: I have reviewed the patients History and Physical, chart, labs and discussed the procedure including the risks, benefits and alternatives for the proposed anesthesia with the patient or authorized representative who has indicated his/her understanding and acceptance.     Plan Discussed with:   Anesthesia Plan Comments:         Anesthesia Quick Evaluation

## 2015-04-12 NOTE — H&P (Signed)
OBSTETRIC ADMISSION HISTORY AND PHYSICAL  Diamond Austin is a 35 y.o. female [phone] with IUP at 108w4d by 15wk presenting for active labor. Patient notes of contractions that began around 7-8am; contractions occur every 10 minutes. She reports +FMs, No LOF, no VB (but light pink discharge early this morning), no blurry vision, headaches or peripheral edema, and RUQ pain.  She plans on breast feeding. She request BTL for birth control.  Dating: By 15wk --->  Estimated Date of Delivery: 04/15/15  Clinic:[facility]  Genetic Screen NT:                            First Screen:               Quad Screen/MSAFP:too late  Anatomic [place] Normal.  Dates +11 days   Glucose Screen Normal 122  GBS negative  Feeding Preference  Breast  Contraception Mirena  Circumcision As outpatient    Prenatal History/Complications: AMA Tobacco use  Obesity Failed 1hr glucola but passed 3hr  Posterior low lying placenta, 1.6 cm from int os- resolved on [place] 01/15/15   Past Medical History: Past Medical History  Diagnosis Date  . Hypercholesteremia   . Elevated liver function tests     2011    Past Surgical History: Past Surgical History  Procedure Laterality Date  . Vaginal delivery      X 1    Obstetrical History: OB History    Gravida Para Term Preterm AB TAB SAB Ectopic Multiple Living   4 2 2  0 1 1 0 0 0 2      Social History: Social History   Social History  . Marital Status: Married    Spouse Name: N/A  . Number of Children: N/A  . Years of Education: N/A   Social History Main Topics  . Smoking status: Current Every Day Smoker -- 0.25 packs/day for 8 years    Types: Cigarettes  . Smokeless tobacco: Never Used  . Alcohol Use: No  . Drug Use: No  . Sexual Activity: Yes    Birth Control/ Protection: None   Other Topics Concern  . None   Social History Narrative    Family History: Family History  Problem Relation Age of Onset  . Osteoporosis Mother   . Hypertension  Father   . Hypertension Brother   . Arthritis Paternal Grandmother     Allergies: No Known Allergies  Prescriptions prior to admission  Medication Sig Dispense Refill Last Dose  . acetaminophen (TYLENOL) 325 MG tablet Take 650 mg by mouth every 6 (six) hours as needed for pain (For headache.).   Past Week at Unknown time  . calcium-vitamin D (OSCAL WITH D) 250-125 MG-UNIT per tablet Take 1 tablet by mouth daily.   Past Week at Unknown time  . Prenatal Vit-Fe Fumarate-FA (PRENATAL MULTIVITAMIN) TABS Take 1 tablet by mouth daily.   04/11/2015 at Unknown time  . psyllium (METAMUCIL) 58.6 % packet Take 1 packet by mouth daily.   Past Week at Unknown time     Review of Systems   All systems reviewed and negative except as stated in HPI  Blood pressure 126/70, pulse 91, temperature 98.9 F (37.2 C), temperature source Oral, resp. rate 18, weight 229 lb (103.874 kg), last menstrual period 06/14/2014, currently breastfeeding. General appearance: alert and cooperative Lungs: clear to auscultation bilaterally Heart: regular rate and rhythm Abdomen: soft, non-tender; bowel sounds normal Extremities: Homans sign  is negative, no sign of DVT Fetal monitoringBaseline: 150 bpm, Variability: Good {> 6 bpm), Accelerations: none seen in the past 18 mins and Decelerations: Absent Uterine activity: every 7 mins   Dilation: 5.5 Effacement (%): 90 Station: -3, -2 Exam by:: Lavonna Rua, RN  Prenatal labs: ABO, Rh:   A pos Antibody:  neg Rubella: Immune RPR: Nonreactive (06/21 0000)  HBsAg:   neg HIV: Non-reactive (06/21 0000)  GBS: Negative (12/21 0000)  1 hr Glucola 138 (failed) however passed 3 hr glucose: fasting 102, 1 hr 148, 2hr 150, 3 hr 114 Genetic screening  Quad neg  Anatomy US normal  Prenatal Transfer Tool  Maternal Diabetes: No; failed 1 hr, passed 3 hr  Genetic Screening: Normal Maternal Ultrasounds/Referrals: Normal Fetal Ultrasounds or other Referrals:  None Maternal  Substance Abuse:  Tobacco use  Significant Maternal Medications:  None Significant Maternal Lab Results: Lab values include: Group B Strep negative  No results found for this or any previous visit (from the past 24 hour(s)).  Patient Active Problem List   Diagnosis Date Noted  . Advanced maternal age in multigravida   . [redacted] weeks gestation of pregnancy     Assessment: Diamond Austin is a 35 y.o. [id number] at [redacted]w[redacted]d here for SOL.   #Labor: SOL will consider augmentation if patient does not self progress.  #Pain: Patient would like to try without pain medications but is open to Epidural #FWB: Cat 1, appropriate for intermittent monitoring #ID: GBS neg  #MOF: breast #MOC: BTL #Circ: n/a  Caren Macadam 04/12/2015, 2:31 PM

## 2015-04-13 ENCOUNTER — Encounter (HOSPITAL_COMMUNITY): Payer: Self-pay | Admitting: *Deleted

## 2015-04-13 DIAGNOSIS — Z3A39 39 weeks gestation of pregnancy: Secondary | ICD-10-CM

## 2015-04-13 DIAGNOSIS — O99214 Obesity complicating childbirth: Secondary | ICD-10-CM

## 2015-04-13 DIAGNOSIS — F17210 Nicotine dependence, cigarettes, uncomplicated: Secondary | ICD-10-CM

## 2015-04-13 DIAGNOSIS — O99334 Smoking (tobacco) complicating childbirth: Secondary | ICD-10-CM

## 2015-04-13 LAB — RPR: RPR: NONREACTIVE

## 2015-04-13 MED ORDER — LACTATED RINGERS IV SOLN
INTRAVENOUS | Status: DC
  Administered 2015-04-14 (×2): via INTRAVENOUS

## 2015-04-13 MED ORDER — WITCH HAZEL-GLYCERIN EX PADS: 1.0000 "application " | MEDICATED_PAD | CUTANEOUS | Status: DC | PRN

## 2015-04-13 MED ORDER — METOCLOPRAMIDE HCL 10 MG PO TABS
10.0000 mg | ORAL_TABLET | Freq: Once | ORAL | Status: AC
  Administered 2015-04-14: 10 mg via ORAL
  Filled 2015-04-13: qty 1

## 2015-04-13 MED ORDER — DIPHENHYDRAMINE HCL 25 MG PO CAPS: 25.0000 mg | ORAL_CAPSULE | Freq: Four times a day (QID) | ORAL | Status: DC | PRN

## 2015-04-13 MED ORDER — ACETAMINOPHEN 325 MG PO TABS: 650.0000 mg | ORAL_TABLET | ORAL | Status: DC | PRN

## 2015-04-13 MED ORDER — FAMOTIDINE 20 MG PO TABS
40.0000 mg | ORAL_TABLET | Freq: Once | ORAL | Status: AC
  Administered 2015-04-14: 40 mg via ORAL
  Filled 2015-04-13: qty 2

## 2015-04-13 MED ORDER — SENNOSIDES-DOCUSATE SODIUM 8.6-50 MG PO TABS
2.0000 | ORAL_TABLET | ORAL | Status: DC
  Administered 2015-04-13 – 2015-04-14 (×2): 2 via ORAL
  Filled 2015-04-13 (×3): qty 2

## 2015-04-13 MED ORDER — DIBUCAINE 1 % RE OINT: 1.0000 "application " | TOPICAL_OINTMENT | RECTAL | Status: DC | PRN

## 2015-04-13 MED ORDER — ZOLPIDEM TARTRATE 5 MG PO TABS: 5.0000 mg | ORAL_TABLET | Freq: Every evening | ORAL | Status: DC | PRN

## 2015-04-13 MED ORDER — PNEUMOCOCCAL VAC POLYVALENT 25 MCG/0.5ML IJ INJ
0.5000 mL | INJECTION | INTRAMUSCULAR | Status: AC
  Administered 2015-04-15: 0.5 mL via INTRAMUSCULAR
  Filled 2015-04-13: qty 0.5

## 2015-04-13 MED ORDER — ONDANSETRON HCL 4 MG/2ML IJ SOLN
4.0000 mg | INTRAMUSCULAR | Status: DC | PRN
  Administered 2015-04-14: 4 mg via INTRAVENOUS

## 2015-04-13 MED ORDER — BENZOCAINE-MENTHOL 20-0.5 % EX AERO: 1.0000 "application " | INHALATION_SPRAY | CUTANEOUS | Status: DC | PRN

## 2015-04-13 MED ORDER — ONDANSETRON HCL 4 MG PO TABS: 4.0000 mg | ORAL_TABLET | ORAL | Status: DC | PRN

## 2015-04-13 MED ORDER — SIMETHICONE 80 MG PO CHEW: 80.0000 mg | CHEWABLE_TABLET | ORAL | Status: DC | PRN

## 2015-04-13 MED ORDER — LANOLIN HYDROUS EX OINT: TOPICAL_OINTMENT | CUTANEOUS | Status: DC | PRN

## 2015-04-13 MED ORDER — TETANUS-DIPHTH-ACELL PERTUSSIS 5-2.5-18.5 LF-MCG/0.5 IM SUSP: 0.5000 mL | Freq: Once | INTRAMUSCULAR | Status: DC

## 2015-04-13 MED ORDER — IBUPROFEN 600 MG PO TABS
600.0000 mg | ORAL_TABLET | Freq: Four times a day (QID) | ORAL | Status: DC
  Administered 2015-04-13 – 2015-04-15 (×7): 600 mg via ORAL
  Filled 2015-04-13 (×8): qty 1

## 2015-04-13 MED ORDER — PRENATAL MULTIVITAMIN CH
1.0000 | ORAL_TABLET | Freq: Every day | ORAL | Status: DC
  Administered 2015-04-13: 1 via ORAL
  Filled 2015-04-13: qty 1

## 2015-04-13 NOTE — Anesthesia Postprocedure Evaluation (Signed)
Anesthesia Post Note  Patient: Diamond Austin  Procedure(s) Performed: * No procedures listed *  Patient location during evaluation: Mother Baby Anesthesia Type: Epidural Level of consciousness: awake and alert Pain management: pain level controlled Vital Signs Assessment: post-procedure vital signs reviewed and stable Respiratory status: spontaneous breathing Cardiovascular status: stable Postop Assessment: no headache, no backache, no signs of nausea or vomiting and adequate PO intake Anesthetic complications: no    Last Vitals:  Filed Vitals:   04/13/15 0205 04/13/15 0335  BP: 116/55 109/50  Pulse: 82 83  Temp: 36.8 C 37.3 C  Resp: 18 18    Last Pain:  Filed Vitals:   04/13/15 0656  PainSc: [place]

## 2015-04-13 NOTE — Progress Notes (Signed)
Labor Progress Note Diamond Austin is a 35 y.o. [id number] at [redacted]w[redacted]d presented for active/latent labor.   S: Feeling very comfortable after epidural. Intermittent pressure.  O:  BP 113/82 mmHg  Pulse 81  Temp(Src) 98.2 F (36.8 C) (Oral)  Resp 16  Ht 5\' 2"  (1.575 m)  Wt 226 lb (102.513 kg)  BMI 41.33 kg/m2  LMP 06/14/2014 EFM: 140/mod/+accels, occasional variable- not deep or recurrent.   CVE: Dilation: Lip/rim Effacement (%): 100 Cervical Position: Middle Station: -1, 0 Presentation: Vertex Exam by:: Dr. Ernestina Patches  AROM, copious clear fluid  A&P: 35 y.o. [id number] [redacted]w[redacted]d in active labor #Labor: Continue pitocin for augmentation. AROM performed.  #Pain: epidural #FWB: Cat I #GBS neg  #Contraception: desire PP BTL and this was discussed with patient.  Discussed with house coverage. OR is full in AM- likely 1400 or later case. Will make patient NPO after 7 AM  Caren Macadam, MD 12:03 AM

## 2015-04-13 NOTE — Lactation Note (Signed)
This note was copied from the chart of Diamond Austin. Lactation Consultation Note  Patient Name: Diamond Austin [id number] Date: 04/13/2015 Reason for consult: Initial assessment Mom reports baby has been spitty today and not wanting to BF since this am. Baby spitty at this visit moderate amount of clear fluid/mucous. Attempted to latch baby but sleepy. Placed baby STS on Mom. Mom is experienced BF. Encouraged to BF with feeding ques, cluster feeding discussed. Lactation brochure left for review, advised of OP services and support group. Encouraged to call for questions/concerns or assist with latch as needed.   Maternal Data Has patient been taught Hand Expression?: Yes Does the patient have breastfeeding experience prior to this delivery?: Yes  Feeding Feeding Type: Breast Fed Length of feed: 0 min  LATCH Score/Interventions                      Lactation Tools Discussed/Used WIC Program: Yes   Consult Status Consult Status: Follow-up Date: 04/14/15 Follow-up type: In-patient    Katrine Coho 04/13/2015, 2:10 PM

## 2015-04-14 ENCOUNTER — Encounter (HOSPITAL_COMMUNITY)
Admission: AD | Disposition: A | Discharge status: Home / Self Care | Payer: Self-pay | Source: Ambulatory Visit | Attending: Obstetrics & Gynecology

## 2015-04-14 ENCOUNTER — Inpatient Hospital Stay (HOSPITAL_COMMUNITY): Payer: BLUE CROSS/BLUE SHIELD | Admitting: Anesthesiology

## 2015-04-14 DIAGNOSIS — Z302 Encounter for sterilization: Secondary | ICD-10-CM

## 2015-04-14 HISTORY — PX: TUBAL LIGATION: SHX77

## 2015-04-14 SURGERY — LIGATION, FALLOPIAN TUBE, POSTPARTUM
Anesthesia: Spinal | Laterality: Bilateral

## 2015-04-14 MED ORDER — OXYCODONE-ACETAMINOPHEN 5-325 MG PO TABS
1.0000 | ORAL_TABLET | Freq: Four times a day (QID) | ORAL | Status: DC | PRN
  Administered 2015-04-14 (×2): 1 via ORAL
  Filled 2015-04-14 (×2): qty 1

## 2015-04-14 MED ORDER — BUPIVACAINE HCL 0.5 % IJ SOLN
INTRAMUSCULAR | Status: DC | PRN
Start: 2015-04-14 — End: 2015-04-14
  Administered 2015-04-14: 30 mL

## 2015-04-14 MED ORDER — PROMETHAZINE HCL 25 MG/ML IJ SOLN: 6.2500 mg | INTRAMUSCULAR | Status: DC | PRN

## 2015-04-14 MED ORDER — MEPERIDINE HCL 25 MG/ML IJ SOLN: 6.2500 mg | INTRAMUSCULAR | Status: DC | PRN

## 2015-04-14 MED ORDER — KETOROLAC TROMETHAMINE 30 MG/ML IJ SOLN
INTRAMUSCULAR | Status: DC | PRN
  Administered 2015-04-14: 30 mg via INTRAVENOUS

## 2015-04-14 MED ORDER — DEXAMETHASONE SODIUM PHOSPHATE 4 MG/ML IJ SOLN
INTRAMUSCULAR | Status: DC | PRN
  Administered 2015-04-14: 4 mg via INTRAVENOUS

## 2015-04-14 MED ORDER — OXYCODONE-ACETAMINOPHEN 5-325 MG PO TABS: 1.0000 | ORAL_TABLET | ORAL | Status: DC | PRN

## 2015-04-14 MED ORDER — FENTANYL CITRATE (PF) 100 MCG/2ML IJ SOLN: 25.0000 ug | INTRAMUSCULAR | Status: DC | PRN

## 2015-04-14 MED ORDER — IBUPROFEN 600 MG PO TABS: 600.0000 mg | ORAL_TABLET | Freq: Four times a day (QID) | ORAL | Status: DC

## 2015-04-14 MED ORDER — MIDAZOLAM HCL 5 MG/5ML IJ SOLN
INTRAMUSCULAR | Status: DC | PRN
  Administered 2015-04-14: 1 mg via INTRAVENOUS

## 2015-04-14 MED ORDER — FENTANYL CITRATE (PF) 100 MCG/2ML IJ SOLN
INTRAMUSCULAR | Status: DC | PRN
  Administered 2015-04-14 (×2): 50 ug via INTRAVENOUS

## 2015-04-14 MED ORDER — BUPIVACAINE IN DEXTROSE 0.75-8.25 % IT SOLN
INTRATHECAL | Status: DC | PRN
  Administered 2015-04-14: 1.5 mL via INTRATHECAL

## 2015-04-14 MED ORDER — OXYCODONE-ACETAMINOPHEN 5-325 MG PO TABS: 1.0000 | ORAL_TABLET | Freq: Four times a day (QID) | ORAL | Status: DC | PRN

## 2015-04-14 MED ORDER — LACTATED RINGERS IV SOLN: INTRAVENOUS | Status: DC

## 2015-04-14 SURGICAL SUPPLY — 23 items
CATH ROBINSON RED A/P 16FR (CATHETERS) IMPLANT
CHLORAPREP W/TINT 26ML (MISCELLANEOUS) ×2 IMPLANT
CLIP FILSHIE TUBAL LIGA STRL (Clip) ×2 IMPLANT
CLOTH BEACON ORANGE TIMEOUT ST (SAFETY) ×2 IMPLANT
DRSG OPSITE POSTOP 3X4 (GAUZE/BANDAGES/DRESSINGS) ×2 IMPLANT
GLOVE BIOGEL PI IND STRL 7.0 (GLOVE) ×3 IMPLANT
GLOVE BIOGEL PI INDICATOR 7.0 (GLOVE) ×3
GLOVE ECLIPSE 7.0 STRL STRAW (GLOVE) ×2 IMPLANT
GOWN STRL REUS W/TWL LRG LVL3 (GOWN DISPOSABLE) ×4 IMPLANT
GOWN STRL REUS W/TWL XL LVL3 (GOWN DISPOSABLE) ×2 IMPLANT
NEEDLE HYPO 22GX1.5 SAFETY (NEEDLE) IMPLANT
NS IRRIG 1000ML POUR BTL (IV SOLUTION) ×2 IMPLANT
PACK ABDOMINAL MINOR (CUSTOM PROCEDURE TRAY) ×2 IMPLANT
SUT VIC AB 0 CT1 27 (SUTURE) ×1
SUT VIC AB 0 CT1 27XBRD ANBCTR (SUTURE) ×1 IMPLANT
SUT VIC AB 3-0 CT1 27 (SUTURE) ×1
SUT VIC AB 3-0 CT1 TAPERPNT 27 (SUTURE) ×1 IMPLANT
SUT VIC AB 3-0 SH 27 (SUTURE) ×1
SUT VIC AB 3-0 SH 27X BRD (SUTURE) ×1 IMPLANT
SUT VIC AB 4-0 PS2 27 (SUTURE) ×2 IMPLANT
SYR CONTROL 10ML LL (SYRINGE) IMPLANT
TOWEL OR 17X24 6PK STRL BLUE (TOWEL DISPOSABLE) ×4 IMPLANT
WATER STERILE IRR 1000ML POUR (IV SOLUTION) ×2 IMPLANT

## 2015-04-14 NOTE — Discharge Summary (Signed)
OB Discharge Summary     Patient Name: Diamond Austin DOB: Dec 21, 1980 MRN: [id number]  Date of admission: 04/12/2015 Delivering MD: Lauretta Chester NILES   Date of discharge: 04/14/2015  Admitting diagnosis: 23WKS CONTRACTIONS desires sterilization Intrauterine pregnancy: [redacted]w[redacted]d     Secondary diagnosis:  Active Problems:   Active labor   Encounter for sterilization     Discharge diagnosis: Term Pregnancy Delivered                                                                                                Post partum procedures:postpartum tubal ligation  Complications: None  Hospital course:  Onset of Labor With Vaginal Delivery     35 y.o. yo [id number] at [redacted]w[redacted]d was admitted in Active Labor on 04/12/2015. Patient had an uncomplicated labor course as follows:  Membrane Rupture Time/Date: 11:52 PM ,04/12/2015   Intrapartum Procedures: Episiotomy: None [1]                                         Lacerations:  None [1]  Patient had a delivery of a Viable infant. 04/13/2015  Information for the patient's newborn:  Dinora, Facenda [id number]  Delivery Method: Vaginal, Spontaneous Delivery (Filed from Delivery Summary)    Pateint had an uncomplicated postpartum course.  She is ambulating, tolerating a regular diet, passing flatus, and urinating well. Patient is discharged home in stable condition on 04/14/2015.    Physical exam  Filed Vitals:   04/14/15 1230 04/14/15 1300 04/14/15 1315 04/14/15 1330  BP: 109/65 116/64 110/91 103/65  Pulse: 62 62 71 64  Temp:      TempSrc:      Resp: 20 19 22 25   Height:      Weight:      SpO2: 95% 96% 96% 98%   General: alert Lochia: appropriate Uterine Fundus: firm DVT Evaluation: No evidence of DVT seen on physical exam. Labs: Lab Results  Component Value Date   WBC 11.8* 04/12/2015   HGB 11.3* 04/12/2015   HCT 32.9* 04/12/2015   MCV 83.9 04/12/2015   PLT 316 04/12/2015   CMP Latest Ref Rng 03/24/2015  Glucose 65 - 99  mg/dL 97  BUN 7 - 25 mg/dL 6(L)  Creatinine 0.50 - 1.10 mg/dL 0.59  Sodium 135 - 146 mmol/L 136  Potassium 3.5 - 5.3 mmol/L 4.0  Chloride 98 - 110 mmol/L 104  CO2 20 - 31 mmol/L 21  Calcium 8.6 - 10.2 mg/dL 9.3  Total Protein 6.1 - 8.1 g/dL 6.9  Total Bilirubin 0.2 - 1.2 mg/dL 0.2  Alkaline Phos 33 - 115 U/L 185(H)  AST 10 - 30 U/L 32(H)  ALT 6 - 29 U/L 30(H)    Discharge instruction: per After Visit Summary and "Baby and Me Booklet".  After visit meds:    Medication List    STOP taking these medications        acetaminophen 325 MG tablet  Commonly known as:  TYLENOL  TAKE these medications        calcium-vitamin D 250-125 MG-UNIT tablet  Commonly known as:  OSCAL WITH D  Take 1 tablet by mouth daily.     ibuprofen 600 MG tablet  Commonly known as:  ADVIL,MOTRIN  Take 1 tablet (600 mg total) by mouth every 6 (six) hours.     oxyCODONE-acetaminophen 5-325 MG tablet  Commonly known as:  ROXICET  Take 1 tablet by mouth every 6 (six) hours as needed for severe pain.     prenatal multivitamin Tabs tablet  Take 1 tablet by mouth daily.     psyllium 58.6 % packet  Commonly known as:  METAMUCIL  Take 1 packet by mouth daily.        Diet: routine diet  Activity: Advance as tolerated. Pelvic rest for 6 weeks.   Outpatient follow up:2 weeks Follow up Appt:No future appointments. Follow up Visit:No Follow-up on file.  Postpartum contraception: Tubal Ligation done 04/14/2015  Newborn Data: Live born female  Birth Weight: 8 lb 9.6 oz (3900 g) APGAR: 8, 9  Baby Feeding: Breast Disposition:home with mother   04/14/2015 Lavonia Drafts, MD

## 2015-04-14 NOTE — Op Note (Signed)
04/12/2015 - 04/14/2015  1:30 PM  PATIENT:  Diamond Austin  35 y.o. female  PRE-OPERATIVE DIAGNOSIS:  desires sterilization  POST-OPERATIVE DIAGNOSIS:  desires sterilization  PROCEDURE:  Procedure(s): POST PARTUM TUBAL LIGATION (Bilateral)  SURGEON:  Surgeon(s) and Role:    * Lavonia Drafts, MD - Primary  ANESTHESIA:   spinal  EBL:  Total I/O In: 1000 [I.V.:1000] Out: -   BLOOD ADMINISTERED:none  DRAINS: none   LOCAL MEDICATIONS USED:  MARCAINE     SPECIMEN:  No Specimen  DISPOSITION OF SPECIMEN:  N/A  COUNTS:  YES  TOURNIQUET:  * No tourniquets in log *  DICTATION: .Note written in EPIC  PLAN OF CARE: patient already has active admit order.  Will discharge to home later today.  PATIENT DISPOSITION:  PACU - hemodynamically stable.   Delay start of Pharmacological VTE agent (>24hrs) due to surgical blood loss or risk of bleeding: not applicable   INDICATIONS: 35 y.o. [id number]  with undesired fertility,status post vaginal delivery, desires permanent sterilization.  Other reversible forms of contraception were discussed with patient; she declines all other modalities. Risks of procedure discussed with patient including but not limited to: risk of regret, permanence of method, bleeding, infection, injury to surrounding organs and need for additional procedures.  Failure risk of 0.5-1% with increased risk of ectopic gestation if pregnancy occurs was also discussed with patient.     FINDINGS:  Normal uterus, tubes, and ovaries.  PROCEDURE DETAILS: The patient was taken to the operating room where her epidural anesthesia was dosed up to surgical level and found to be adequate.  She was then placed in the dorsal supine position and prepped and draped in sterile fashion.  After an adequate timeout was performed, attention was turned to the patient's abdomen where a small transverse skin incision was made under the umbilical fold. The incision was taken down to the layer  of fascia using the scalpel, and fascia was incised, and extended bilaterally using Mayo scissors. The peritoneum was entered in a sharp fashion. Attention was then turned to the patient's uterus, and left fallopian tube was identified and followed out to the fimbriated end.  A Filshie clip was placed on the left fallopian tube about 3 cm from the cornual attachment, with care given to incorporate the underlying mesosalpinx.  A similar process was carried out on the right side allowing for bilateral tubal sterilization.  Good hemostasis was noted overall.  The instruments were then removed from the patient's abdomen and the fascial incision was repaired with 0 Vicryl, and the skin was closed with a 4-0 Vicryl subcuticular stitch. The patient tolerated the procedure well.  Instrument, sponge, and needle counts were correct times two.  The patient was then taken to the recovery room awake and in stable condition.  Ethelwyn Gilbertson L. Harraway-Smith, M.D., Cherlynn June

## 2015-04-14 NOTE — Progress Notes (Signed)
Post Partum Day 1  Subjective: No complaints  Objective: Blood pressure 110/53, pulse 81, temperature 98.5 F (36.9 C), temperature source Oral, resp. rate 18, height 5\' 2"  (1.575 m), weight 102.513 kg (226 lb), last menstrual period 06/14/2014, SpO2 98 %, unknown if currently breastfeeding.  Physical Exam:  General: alert, cooperative and no distress Lochia: appropriate Uterine Fundus: unable to feel due to obesity, non-tender DVT Evaluation: No evidence of DVT seen on physical exam.   Recent Labs  04/12/15 1516  HGB 11.3*  HCT 32.9*    Assessment/Plan: -Contraception; patient for BTL today, currently NPO -Breastfeeding; trouble with latch yesterday, however baby ate well overnight and is voiding.    LOS: 2 days   Diamond Austin 04/14/2015, 7:22 AM   CNM attestation Post Partum Day #1  Diamond Austin is a 35 y.o. [id number] s/p SVD.  Pt denies problems with ambulating, voiding or po intake. Pain is well controlled.  Plan for birth control is bilateral tubal ligation.  Method of Feeding: breast  PE:  BP 110/53 mmHg  Pulse 81  Temp(Src) 98.5 F (36.9 C) (Oral)  Resp 18  Ht 5\' 2"  (1.575 m)  Wt 102.513 kg (226 lb)  BMI 41.33 kg/m2  SpO2 98%  LMP 06/14/2014  Breastfeeding? Unknown Fundus firm  Plan for discharge: possibly this afternoon  Diamond Austin, CNM 9:20 AM  04/14/2015

## 2015-04-14 NOTE — Brief Op Note (Signed)
04/12/2015 - 04/14/2015  1:30 PM  PATIENT:  Cy Blamer  35 y.o. female  PRE-OPERATIVE DIAGNOSIS:  desires sterilization  POST-OPERATIVE DIAGNOSIS:  desires sterilization  PROCEDURE:  Procedure(s): POST PARTUM TUBAL LIGATION (Bilateral)  SURGEON:  Surgeon(s) and Role:    * Lavonia Drafts, MD - Primary  ANESTHESIA:   spinal  EBL:  Total I/O In: 1000 [I.V.:1000] Out: -   BLOOD ADMINISTERED:none  DRAINS: none   LOCAL MEDICATIONS USED:  MARCAINE     SPECIMEN:  No Specimen  DISPOSITION OF SPECIMEN:  N/A  COUNTS:  YES  TOURNIQUET:  * No tourniquets in log *  DICTATION: .Note written in EPIC  PLAN OF CARE: patient already has active admit order.  Will discharge to home later today.  PATIENT DISPOSITION:  PACU - hemodynamically stable.   Delay start of Pharmacological VTE agent (>24hrs) due to surgical blood loss or risk of bleeding: not applicable

## 2015-04-14 NOTE — Anesthesia Preprocedure Evaluation (Signed)
Anesthesia Evaluation  Patient identified by MRN, date of birth, ID band Patient awake    Reviewed: Allergy & Precautions, H&P , NPO status , Patient's Chart, lab work & pertinent test results  Airway Mallampati: II  TM Distance: >3 FB Neck ROM: full    Dental no notable dental hx.    Pulmonary Current Smoker,    Pulmonary exam normal        Cardiovascular negative cardio ROS Normal cardiovascular exam     Neuro/Psych negative neurological ROS  negative psych ROS   GI/Hepatic negative GI ROS, Neg liver ROS,   Endo/Other  Morbid obesity  Renal/GU negative Renal ROS     Musculoskeletal   Abdominal (+) + obese,   Peds  Hematology negative hematology ROS (+)   Anesthesia Other Findings   Reproductive/Obstetrics                             Anesthesia Physical  Anesthesia Plan  ASA: III  Anesthesia Plan: Spinal   Post-op Pain Management:    Induction:   Airway Management Planned:   Additional Equipment:   Intra-op Plan:   Post-operative Plan:   Informed Consent: I have reviewed the patients History and Physical, chart, labs and discussed the procedure including the risks, benefits and alternatives for the proposed anesthesia with the patient or authorized representative who has indicated his/her understanding and acceptance.   Dental advisory given  Plan Discussed with:   Anesthesia Plan Comments:         Anesthesia Quick Evaluation

## 2015-04-14 NOTE — Lactation Note (Addendum)
This note was copied from the chart of Diamond Austin. Lactation Consultation Note  Patient Name: Diamond Rickiya Maglio [id number] Date: 04/14/2015 Reason for consult: Follow-up assessment  Baby to breast 8 times for 10-30 minutes, 5 attempts at breast in 1st 38 hours. Baby has had 1 void but was after the 1st 24 hours of age, no voids since, 4 stools in life. Baby at 4.7% weight loss at 24 hours of age.  Mom reports she feels baby is nursing well. Mom had BTL today so baby missed some feedings. Advised Mom baby will probably cluster feed tonight. Baby STS on Mom at present and asleep, recently BF for 40 minutes.  Advised baby should be at the breast 8-12 times or more in 24 hours, nursing for 15-30 minutes both breasts when possible.  Manual hand pump given to Mom for use at home if needed. Changed flange to size 27. Lots of colostrum present with demonstration of hand pump. Encouraged Mom to call with feeding for latch check due to weight loss and output.   Maternal Data    Feeding Feeding Type: Breast Fed Length of feed: 40 min  LATCH Score/Interventions                      Lactation Tools Discussed/Used Tools: Pump Breast pump type: Manual   Consult Status Consult Status: Follow-up Date: 04/15/15 Follow-up type: In-patient    Katrine Coho 04/14/2015, 4:09 PM

## 2015-04-14 NOTE — Lactation Note (Signed)
This note was copied from the chart of Diamond Varsha Sampat. Lactation Consultation Note  Patient Name: Diamond Austin [id number] Date: 04/14/2015 Reason for consult: Follow-up assessment Mom latched baby in cradle hold, baby appeared to have good depth, lips well flanged. Good suckling bursts observed with some swallows. Discussed with Mom weight loss and output. Advised to monitor voids/stools. BF with feeding ques but at least 8-12 times in 24 hours. Try to BF both breasts when possible 15-20 minutes. If Mom does not hear swallows or output does not increase, post pump for 15 minutes after feedings and give baby back any amount of EBM she receives. To call for assist if interested in spoon,  finger feeding, or cup feeding. If baby not obtaining good depth encouraged to try football hold. Call for assist, questions or concerns.   Maternal Data    Feeding Feeding Type: Breast Fed Length of feed: 40 min  LATCH Score/Interventions Latch: Grasps breast easily, tongue down, lips flanged, rhythmical sucking. Intervention(s): Breast massage  Audible Swallowing: A few with stimulation  Type of Nipple: Everted at rest and after stimulation  Comfort (Breast/Nipple): Soft / non-tender     Hold (Positioning): No assistance needed to correctly position infant at breast.  LATCH Score: 9  Lactation Tools Discussed/Used Tools: Pump Breast pump type: Manual   Consult Status Consult Status: Follow-up Date: 04/15/15 Follow-up type: In-patient    Katrine Coho 04/14/2015, 4:43 PM

## 2015-04-14 NOTE — Anesthesia Postprocedure Evaluation (Signed)
Anesthesia Post Note  Patient: Diamond Austin  Procedure(s) Performed: Procedure(s) (LRB): POST PARTUM TUBAL LIGATION (Bilateral)  Patient location during evaluation: Mother Baby Anesthesia Type: Spinal Level of consciousness: awake and alert and oriented Pain management: satisfactory to patient Vital Signs Assessment: post-procedure vital signs reviewed and stable Respiratory status: respiratory function stable and spontaneous breathing Cardiovascular status: blood pressure returned to baseline Postop Assessment: no headache, no backache, spinal receding, patient able to bend at knees and adequate PO intake Anesthetic complications: no    Last Vitals:  Filed Vitals:   04/14/15 1505 04/14/15 2015  BP: 138/79 110/61  Pulse: 82 82  Temp: 37.1 C 37.1 C  Resp: 18 18    Last Pain:  Filed Vitals:   04/14/15 2157  PainSc: 4                  Emaline Karnes

## 2015-04-14 NOTE — Transfer of Care (Signed)
Immediate Anesthesia Transfer of Care Note  Patient: Diamond Austin  Procedure(s) Performed: Procedure(s): POST PARTUM TUBAL LIGATION (Bilateral)  Patient Location: PACU  Anesthesia Type:Spinal  Level of Consciousness: awake, alert  and oriented  Airway & Oxygen Therapy: Patient Spontanous Breathing  Post-op Assessment: Report given to RN and Post -op Vital signs reviewed and stable  Post vital signs: Reviewed and stable  Last Vitals:  Filed Vitals:   04/13/15 1803 04/14/15 0620  BP: 121/72 110/53  Pulse: 80 81  Temp: 37 C 36.9 C  Resp: 18 18    Complications: No apparent anesthesia complications

## 2015-04-14 NOTE — Anesthesia Procedure Notes (Signed)
Spinal Patient location during procedure: OR Staffing Anesthesiologist: Montez Hageman Performed by: anesthesiologist  Preanesthetic Checklist Completed: patient identified, site marked, surgical consent, pre-op evaluation, timeout performed, IV checked, risks and benefits discussed and monitors and equipment checked Spinal Block Patient position: sitting Prep: DuraPrep Patient monitoring: heart rate, continuous pulse ox and blood pressure Approach: right paramedian Location: L3-4 Injection technique: single-shot Needle Needle type: Sprotte  Needle gauge: 24 G Needle length: 9 cm Additional Notes Expiration date of kit checked and confirmed. Patient tolerated procedure well, without complications.

## 2015-04-14 NOTE — Addendum Note (Signed)
Addendum  created 04/14/15 2248 by Flossie Dibble, CRNA   Modules edited: Notes Section   Notes Section:  File: [id number]

## 2015-04-14 NOTE — Anesthesia Postprocedure Evaluation (Signed)
Anesthesia Post Note  Patient: Diamond Austin  Procedure(s) Performed: Procedure(s) (LRB): POST PARTUM TUBAL LIGATION (Bilateral)  Patient location during evaluation: PACU Anesthesia Type: Spinal Level of consciousness: awake Pain management: pain level controlled Vital Signs Assessment: post-procedure vital signs reviewed and stable Respiratory status: spontaneous breathing Cardiovascular status: stable Postop Assessment: no headache, no backache, spinal receding, patient able to bend at knees and no signs of nausea or vomiting Anesthetic complications: no    Last Vitals:  Filed Vitals:   04/14/15 1330 04/14/15 1345  BP: 103/65   Pulse: 64   Temp:  36.8 C  Resp: 25     Last Pain:  Filed Vitals:   04/14/15 1348  PainSc: [place]

## 2015-04-14 NOTE — Discharge Instructions (Signed)
Postpartum Tubal Ligation, Care After Refer to this sheet in the next few weeks. These instructions provide you with information about caring for yourself after your procedure. Your health care provider may also give you more specific instructions. Your treatment has been planned according to current medical practices, but problems sometimes occur. Call your health care provider if you have any problems or questions after your procedure. WHAT TO EXPECT AFTER THE PROCEDURE After your procedure, it is common to have:  Sore throat.  Soreness at the incision site.  Mild cramping.  Tiredness.  Mild nausea or vomiting. HOME CARE INSTRUCTIONS  Rest for the remainder of the day.  Take medicines only as directed by your health care provider. These include over-the-counter medicines and prescription medicines. Do not take aspirin, which can cause bleeding.  Over the next few days, gradually return to your normal activities and your normal diet.  Avoid sexual intercourse for 2 weeks or as directed by your health care provider.  Do not drive or operate heavy machinery while taking pain medicine.  Do not lift anything that is heavier than 5 lb (2.3 kg) for 2 weeks or as directed by your health care provider.  Do not take baths. Take showers only. Ask your health care provider when you can start taking baths.  Take your temperature twice each day and write it down.  Try to have help for the first 7-10 days for your household needs.  There are many different ways to close and cover an incision, including stitches (sutures), skin glue, and adhesive strips. Follow instructions from your health care provider about:  Incision care.  Bandage (dressing) changes and removal.  Incision closure removal.  Check your incision area every day for signs of infection. Watch for:  Redness, swelling, or pain.  Fluid, blood, or pus.  Keep all follow-up visits as directed by your health care  provider. SEEK MEDICAL CARE IF:  You have redness, swelling, or increasing pain in your incision area.  You have fluid or pus coming from your incision for longer than 1 day.  You notice a bad smell coming from your incision or your dressing.  The edges of your incision break open after the sutures have been removed.  Your pain does not decrease after 2-3 days.  You have a rash.  You repeatedly become dizzy or light-headed.  You have a reaction to your medicine.  Your pain medicine is not helping.  You are constipated. SEEK IMMEDIATE MEDICAL CARE IF:   You have a fever.  You faint.  You have increasing pain in your abdomen.  You have bleeding or drainage from your suture sites or your vagina after surgery.  You have shortness of breath or have difficulty breathing.  You have chest pain or leg pain.  You have ongoing nausea, vomiting, or diarrhea.   This information is not intended to replace advice given to you by your health care provider. Make sure you discuss any questions you have with your health care provider.   Document Released: 09/18/2011 Document Revised: 08/03/2014 Document Reviewed: 09/18/2011 Elsevier Interactive Patient Education [employer].

## 2015-04-15 ENCOUNTER — Encounter (HOSPITAL_COMMUNITY): Payer: Self-pay | Admitting: Obstetrics & Gynecology

## 2015-04-15 NOTE — Progress Notes (Signed)
Post Partum Day 2 Subjective: no complaints; doing well s/p BTL yesterday morning. Continues breastfeeding.  No BM but passing gas.  Objective: Blood pressure 117/69, pulse 65, temperature 98.5 F (36.9 C), temperature source Oral, resp. rate 18, height 5\' 2"  (1.575 m), weight 102.513 kg (226 lb), last menstrual period 06/14/2014, SpO2 98 %, unknown if currently breastfeeding.  Physical Exam:  General: alert, cooperative and no distress Lochia: appropriate Uterine Fundus: firm Incision: healing well, no significant drainage, no significant erythema DVT Evaluation: No evidence of DVT seen on physical exam. No significant calf/ankle edema.   Recent Labs  04/12/15 1516  HGB 11.3*  HCT 32.9*    Assessment/Plan: Discharge home   LOS: 3 days   [name] 04/15/2015, 7:17 AM   I have seen and examined this patient and agree the above assessment.  Respiratory effort normal, lochia appropriate, legs negative,  pain level normal.  CRESENZO-DISHMAN,Bill Mcvey 04/15/2015 3:06 PM

## 2015-04-15 NOTE — Discharge Summary (Signed)
OB Discharge Summary     Patient Name: Diamond Austin DOB: 1980/10/15 MRN: [id number]  Date of admission: 04/12/2015 Delivering MD: Lauretta Chester NILES   Date of discharge: 04/15/2015  Admitting diagnosis: 8WKS CONTRACTIONS desires sterilization Intrauterine pregnancy: [redacted]w[redacted]d     Secondary diagnosis:  Active Problems:   Active labor   Encounter for sterilization  Additional problems: none     Discharge diagnosis: Term Pregnancy Delivered                                                                                                Post partum procedures:postpartum tubal ligation  Augmentation: AROM  Complications: None  Hospital course:  Onset of Labor With Vaginal Delivery     35 y.o. yo [id number] at [redacted]w[redacted]d was admitted in Active Labor on 04/12/2015. Patient had an uncomplicated labor course as follows:  Membrane Rupture Time/Date: 11:52 PM ,04/12/2015   Intrapartum Procedures: Episiotomy: None [1]                                         Lacerations:  None [1]  Patient had a delivery of a Viable infant. 04/13/2015  Information for the patient's newborn:  Iyanuoluwa, Roiger [id number]  Delivery Method: Vaginal, Spontaneous Delivery (Filed from Delivery Summary)    Pateint had an uncomplicated postpartum course.  She is ambulating, tolerating a regular diet, passing flatus, and urinating well. Patient is discharged home in stable condition on 04/15/2015.    Physical exam  Filed Vitals:   04/14/15 1505 04/14/15 2015 04/14/15 2325 04/15/15 0523  BP: 138/79 110/61 121/68 117/69  Pulse: 82 82 66 65  Temp: 98.7 F (37.1 C) 98.7 F (37.1 C) 98.4 F (36.9 C) 98.5 F (36.9 C)  TempSrc: Oral Oral Oral Oral  Resp: 18 18 18 18   Height:      Weight:      SpO2: 97% 98% 97% 98%   General: alert, cooperative and no distress Lochia: appropriate Uterine Fundus: firm Incision: Healing well with no significant drainage DVT Evaluation: No evidence of DVT seen on physical  exam. Labs: Lab Results  Component Value Date   WBC 11.8* 04/12/2015   HGB 11.3* 04/12/2015   HCT 32.9* 04/12/2015   MCV 83.9 04/12/2015   PLT 316 04/12/2015   CMP Latest Ref Rng 03/24/2015  Glucose 65 - 99 mg/dL 97  BUN 7 - 25 mg/dL 6(L)  Creatinine 0.50 - 1.10 mg/dL 0.59  Sodium 135 - 146 mmol/L 136  Potassium 3.5 - 5.3 mmol/L 4.0  Chloride 98 - 110 mmol/L 104  CO2 20 - 31 mmol/L 21  Calcium 8.6 - 10.2 mg/dL 9.3  Total Protein 6.1 - 8.1 g/dL 6.9  Total Bilirubin 0.2 - 1.2 mg/dL 0.2  Alkaline Phos 33 - 115 U/L 185(H)  AST 10 - 30 U/L 32(H)  ALT 6 - 29 U/L 30(H)    Discharge instruction: per After Visit Summary and "Baby and Me Booklet".  After visit meds:    Medication  List    STOP taking these medications        acetaminophen 325 MG tablet  Commonly known as:  TYLENOL      TAKE these medications        calcium-vitamin D 250-125 MG-UNIT tablet  Commonly known as:  OSCAL WITH D  Take 1 tablet by mouth daily.     ibuprofen 600 MG tablet  Commonly known as:  ADVIL,MOTRIN  Take 1 tablet (600 mg total) by mouth every 6 (six) hours.     oxyCODONE-acetaminophen 5-325 MG tablet  Commonly known as:  ROXICET  Take 1 tablet by mouth every 6 (six) hours as needed for severe pain.     prenatal multivitamin Tabs tablet  Take 1 tablet by mouth daily.     psyllium 58.6 % packet  Commonly known as:  METAMUCIL  Take 1 packet by mouth daily.        Diet: routine diet  Activity: Advance as tolerated. Pelvic rest for 6 weeks.   Outpatient follow up:6 weeks Follow up Appt:No future appointments. Follow up Visit:No Follow-up on file.  Postpartum contraception: Tubal Ligation  Newborn Data: Live born female  Birth Weight: 8 lb 9.6 oz (3900 g) APGAR: 8, 9  Baby Feeding: Breast Disposition:home with mother   04/15/2015 Wyvonnia Dusky, CNM

## 2015-04-15 NOTE — Lactation Note (Signed)
This note was copied from the chart of Diamond Elliott Dilauro. Lactation Consultation Note: Experienced BF mom reports baby cluster fed through the night and nipples are a little tender this morning, Baby asleep in car seat at this time. Reports breasts are feeling a little fuller this morning. Plenty of voids and stools. No questions at present To call prn Patient Name: Diamond Austin [id number] Date: 04/15/2015 Reason for consult: Follow-up assessment   Maternal Data Formula Feeding for Exclusion: No Has patient been taught Hand Expression?: Yes Does the patient have breastfeeding experience prior to this delivery?: Yes  Feeding Feeding Type: Breast Fed Length of feed: 15 min  LATCH Score/Interventions                      Lactation Tools Discussed/Used     Consult Status      Truddie Crumble 04/15/2015, 10:52 AM

## 2015-11-22 DIAGNOSIS — E781 Pure hyperglyceridemia: Secondary | ICD-10-CM | POA: Diagnosis not present

## 2015-11-22 DIAGNOSIS — Z23 Encounter for immunization: Secondary | ICD-10-CM | POA: Diagnosis not present

## 2015-11-22 DIAGNOSIS — E559 Vitamin D deficiency, unspecified: Secondary | ICD-10-CM | POA: Diagnosis not present

## 2015-11-22 DIAGNOSIS — Z0000 Encounter for general adult medical examination without abnormal findings: Secondary | ICD-10-CM | POA: Diagnosis not present

## 2015-11-22 DIAGNOSIS — R7301 Impaired fasting glucose: Secondary | ICD-10-CM | POA: Diagnosis not present

## 2015-12-22 DIAGNOSIS — L03031 Cellulitis of right toe: Secondary | ICD-10-CM | POA: Diagnosis not present

## 2015-12-22 DIAGNOSIS — M79674 Pain in right toe(s): Secondary | ICD-10-CM | POA: Diagnosis not present

## 2016-01-23 DIAGNOSIS — B351 Tinea unguium: Secondary | ICD-10-CM | POA: Diagnosis not present

## 2016-01-23 DIAGNOSIS — M79674 Pain in right toe(s): Secondary | ICD-10-CM | POA: Diagnosis not present

## 2016-04-30 DIAGNOSIS — R94112 Abnormal visually evoked potential [VEP]: Secondary | ICD-10-CM | POA: Diagnosis not present

## 2016-04-30 DIAGNOSIS — H53413 Scotoma involving central area, bilateral: Secondary | ICD-10-CM | POA: Diagnosis not present

## 2016-08-02 DIAGNOSIS — L309 Dermatitis, unspecified: Secondary | ICD-10-CM | POA: Diagnosis not present

## 2016-08-02 DIAGNOSIS — L918 Other hypertrophic disorders of the skin: Secondary | ICD-10-CM | POA: Diagnosis not present

## 2016-09-12 DIAGNOSIS — R51 Headache: Secondary | ICD-10-CM | POA: Diagnosis not present

## 2016-10-04 DIAGNOSIS — H53413 Scotoma involving central area, bilateral: Secondary | ICD-10-CM | POA: Diagnosis not present

## 2016-10-04 DIAGNOSIS — H00025 Hordeolum internum left lower eyelid: Secondary | ICD-10-CM | POA: Diagnosis not present

## 2016-11-26 DIAGNOSIS — S00212A Abrasion of left eyelid and periocular area, initial encounter: Secondary | ICD-10-CM | POA: Diagnosis not present

## 2016-11-26 DIAGNOSIS — E559 Vitamin D deficiency, unspecified: Secondary | ICD-10-CM | POA: Diagnosis not present

## 2016-11-26 DIAGNOSIS — E781 Pure hyperglyceridemia: Secondary | ICD-10-CM | POA: Diagnosis not present

## 2016-11-26 DIAGNOSIS — R7303 Prediabetes: Secondary | ICD-10-CM | POA: Diagnosis not present

## 2016-11-26 DIAGNOSIS — Z0000 Encounter for general adult medical examination without abnormal findings: Secondary | ICD-10-CM | POA: Diagnosis not present

## 2016-11-26 DIAGNOSIS — Z23 Encounter for immunization: Secondary | ICD-10-CM | POA: Diagnosis not present

## 2016-11-26 DIAGNOSIS — H02814 Retained foreign body in left upper eyelid: Secondary | ICD-10-CM | POA: Diagnosis not present

## 2016-11-27 DIAGNOSIS — S00212D Abrasion of left eyelid and periocular area, subsequent encounter: Secondary | ICD-10-CM | POA: Diagnosis not present

## 2016-12-04 DIAGNOSIS — S00212D Abrasion of left eyelid and periocular area, subsequent encounter: Secondary | ICD-10-CM | POA: Diagnosis not present

## 2017-04-02 DIAGNOSIS — R7303 Prediabetes: Secondary | ICD-10-CM

## 2017-04-02 HISTORY — DX: Prediabetes: R73.03

## 2017-04-04 DIAGNOSIS — H00011 Hordeolum externum right upper eyelid: Secondary | ICD-10-CM | POA: Diagnosis not present

## 2017-04-23 DIAGNOSIS — H00011 Hordeolum externum right upper eyelid: Secondary | ICD-10-CM | POA: Diagnosis not present

## 2017-05-06 DIAGNOSIS — M79671 Pain in right foot: Secondary | ICD-10-CM | POA: Diagnosis not present

## 2017-06-27 DIAGNOSIS — L2089 Other atopic dermatitis: Secondary | ICD-10-CM | POA: Diagnosis not present

## 2017-12-10 DIAGNOSIS — Z713 Dietary counseling and surveillance: Secondary | ICD-10-CM | POA: Diagnosis not present

## 2017-12-20 DIAGNOSIS — Z23 Encounter for immunization: Secondary | ICD-10-CM | POA: Diagnosis not present

## 2017-12-20 DIAGNOSIS — R5383 Other fatigue: Secondary | ICD-10-CM | POA: Diagnosis not present

## 2017-12-20 DIAGNOSIS — E559 Vitamin D deficiency, unspecified: Secondary | ICD-10-CM | POA: Diagnosis not present

## 2017-12-20 DIAGNOSIS — R7303 Prediabetes: Secondary | ICD-10-CM | POA: Diagnosis not present

## 2017-12-20 DIAGNOSIS — Z0000 Encounter for general adult medical examination without abnormal findings: Secondary | ICD-10-CM | POA: Diagnosis not present

## 2017-12-24 DIAGNOSIS — Z1231 Encounter for screening mammogram for malignant neoplasm of breast: Secondary | ICD-10-CM | POA: Diagnosis not present

## 2018-04-08 DIAGNOSIS — Z713 Dietary counseling and surveillance: Secondary | ICD-10-CM | POA: Diagnosis not present

## 2018-08-27 DIAGNOSIS — Z713 Dietary counseling and surveillance: Secondary | ICD-10-CM | POA: Diagnosis not present

## 2018-12-23 DIAGNOSIS — L659 Nonscarring hair loss, unspecified: Secondary | ICD-10-CM | POA: Diagnosis not present

## 2018-12-23 DIAGNOSIS — Z0000 Encounter for general adult medical examination without abnormal findings: Secondary | ICD-10-CM | POA: Diagnosis not present

## 2018-12-23 DIAGNOSIS — Z23 Encounter for immunization: Secondary | ICD-10-CM | POA: Diagnosis not present

## 2018-12-23 DIAGNOSIS — E781 Pure hyperglyceridemia: Secondary | ICD-10-CM | POA: Diagnosis not present

## 2018-12-23 DIAGNOSIS — Z6835 Body mass index (BMI) 35.0-35.9, adult: Secondary | ICD-10-CM | POA: Diagnosis not present

## 2018-12-23 DIAGNOSIS — R7303 Prediabetes: Secondary | ICD-10-CM | POA: Diagnosis not present

## 2018-12-25 ENCOUNTER — Other Ambulatory Visit: Payer: Self-pay | Admitting: Family Medicine

## 2018-12-25 DIAGNOSIS — N644 Mastodynia: Secondary | ICD-10-CM

## 2019-01-02 ENCOUNTER — Other Ambulatory Visit: Payer: Self-pay | Admitting: Family Medicine

## 2019-01-02 ENCOUNTER — Other Ambulatory Visit (HOSPITAL_COMMUNITY)
Admission: RE | Admit: 2019-01-02 | Discharge: 2019-01-02 | Disposition: A | Discharge status: Home / Self Care | Payer: BC Managed Care – PPO | Source: Ambulatory Visit | Attending: Family Medicine | Admitting: Family Medicine

## 2019-01-02 DIAGNOSIS — Z124 Encounter for screening for malignant neoplasm of cervix: Secondary | ICD-10-CM | POA: Diagnosis not present

## 2019-01-09 LAB — CYTOLOGY - PAP
Diagnosis: NEGATIVE
High risk HPV: NEGATIVE

## 2019-01-11 DIAGNOSIS — Z713 Dietary counseling and surveillance: Secondary | ICD-10-CM | POA: Diagnosis not present

## 2019-01-16 ENCOUNTER — Ambulatory Visit
Admission: RE | Admit: 2019-01-16 | Discharge: 2019-01-16 | Disposition: A | Discharge status: Home / Self Care | Payer: BC Managed Care – PPO | Source: Ambulatory Visit | Attending: Family Medicine | Admitting: Family Medicine

## 2019-01-16 ENCOUNTER — Other Ambulatory Visit: Payer: Self-pay

## 2019-01-16 ENCOUNTER — Ambulatory Visit
Admission: RE | Admit: 2019-01-16 | Discharge: 2019-01-16 | Disposition: A | Discharge status: Home / Self Care | Payer: BLUE CROSS/BLUE SHIELD | Source: Ambulatory Visit | Attending: Family Medicine | Admitting: Family Medicine

## 2019-01-16 DIAGNOSIS — N6002 Solitary cyst of left breast: Secondary | ICD-10-CM | POA: Diagnosis not present

## 2019-01-16 DIAGNOSIS — N644 Mastodynia: Secondary | ICD-10-CM

## 2019-01-16 DIAGNOSIS — R928 Other abnormal and inconclusive findings on diagnostic imaging of breast: Secondary | ICD-10-CM | POA: Diagnosis not present

## 2019-03-05 DIAGNOSIS — E559 Vitamin D deficiency, unspecified: Secondary | ICD-10-CM | POA: Diagnosis not present

## 2019-07-31 DIAGNOSIS — L601 Onycholysis: Secondary | ICD-10-CM | POA: Diagnosis not present

## 2019-07-31 DIAGNOSIS — S6701XA Crushing injury of right thumb, initial encounter: Secondary | ICD-10-CM | POA: Diagnosis not present

## 2019-12-28 DIAGNOSIS — E781 Pure hyperglyceridemia: Secondary | ICD-10-CM | POA: Diagnosis not present

## 2019-12-28 DIAGNOSIS — N921 Excessive and frequent menstruation with irregular cycle: Secondary | ICD-10-CM | POA: Diagnosis not present

## 2019-12-28 DIAGNOSIS — Z0000 Encounter for general adult medical examination without abnormal findings: Secondary | ICD-10-CM | POA: Diagnosis not present

## 2019-12-28 DIAGNOSIS — R7303 Prediabetes: Secondary | ICD-10-CM | POA: Diagnosis not present

## 2019-12-28 DIAGNOSIS — R300 Dysuria: Secondary | ICD-10-CM | POA: Diagnosis not present

## 2019-12-28 DIAGNOSIS — Z23 Encounter for immunization: Secondary | ICD-10-CM | POA: Diagnosis not present

## 2019-12-29 ENCOUNTER — Other Ambulatory Visit: Payer: Self-pay | Admitting: Family Medicine

## 2019-12-29 DIAGNOSIS — N921 Excessive and frequent menstruation with irregular cycle: Secondary | ICD-10-CM

## 2019-12-31 ENCOUNTER — Ambulatory Visit
Admission: RE | Admit: 2019-12-31 | Discharge: 2019-12-31 | Disposition: A | Discharge status: Home / Self Care | Payer: BC Managed Care – PPO | Source: Ambulatory Visit | Attending: Family Medicine | Admitting: Family Medicine

## 2019-12-31 DIAGNOSIS — N888 Other specified noninflammatory disorders of cervix uteri: Secondary | ICD-10-CM | POA: Diagnosis not present

## 2019-12-31 DIAGNOSIS — N838 Other noninflammatory disorders of ovary, fallopian tube and broad ligament: Secondary | ICD-10-CM | POA: Diagnosis not present

## 2019-12-31 DIAGNOSIS — N921 Excessive and frequent menstruation with irregular cycle: Secondary | ICD-10-CM

## 2019-12-31 DIAGNOSIS — D259 Leiomyoma of uterus, unspecified: Secondary | ICD-10-CM | POA: Diagnosis not present

## 2019-12-31 DIAGNOSIS — N920 Excessive and frequent menstruation with regular cycle: Secondary | ICD-10-CM | POA: Diagnosis not present

## 2020-01-15 DIAGNOSIS — N939 Abnormal uterine and vaginal bleeding, unspecified: Secondary | ICD-10-CM | POA: Diagnosis not present

## 2020-01-15 DIAGNOSIS — N926 Irregular menstruation, unspecified: Secondary | ICD-10-CM | POA: Diagnosis not present

## 2020-01-15 DIAGNOSIS — N949 Unspecified condition associated with female genital organs and menstrual cycle: Secondary | ICD-10-CM | POA: Diagnosis not present

## 2020-01-15 DIAGNOSIS — Z124 Encounter for screening for malignant neoplasm of cervix: Secondary | ICD-10-CM | POA: Diagnosis not present

## 2020-01-19 ENCOUNTER — Telehealth: Payer: Self-pay | Admitting: *Deleted

## 2020-01-19 NOTE — Telephone Encounter (Signed)
Called the patient and scheduled an appt for 10/25. Gave the patient the address and policy for mask/visitors

## 2020-01-25 ENCOUNTER — Other Ambulatory Visit: Payer: Self-pay

## 2020-01-25 ENCOUNTER — Inpatient Hospital Stay: Payer: BC Managed Care – PPO | Attending: Gynecologic Oncology | Admitting: Gynecologic Oncology

## 2020-01-25 ENCOUNTER — Encounter: Payer: Self-pay | Admitting: Gynecologic Oncology

## 2020-01-25 VITALS — BP 136/84 | HR 81 | Temp 98.7°F | Resp 18 | Ht 63.0 in | Wt 191.2 lb

## 2020-01-25 DIAGNOSIS — N939 Abnormal uterine and vaginal bleeding, unspecified: Secondary | ICD-10-CM | POA: Diagnosis not present

## 2020-01-25 DIAGNOSIS — F17210 Nicotine dependence, cigarettes, uncomplicated: Secondary | ICD-10-CM | POA: Insufficient documentation

## 2020-01-25 DIAGNOSIS — Z79899 Other long term (current) drug therapy: Secondary | ICD-10-CM | POA: Diagnosis not present

## 2020-01-25 DIAGNOSIS — N838 Other noninflammatory disorders of ovary, fallopian tube and broad ligament: Secondary | ICD-10-CM | POA: Insufficient documentation

## 2020-01-25 DIAGNOSIS — D3912 Neoplasm of uncertain behavior of left ovary: Secondary | ICD-10-CM | POA: Diagnosis not present

## 2020-01-25 DIAGNOSIS — E7800 Pure hypercholesterolemia, unspecified: Secondary | ICD-10-CM | POA: Diagnosis not present

## 2020-01-25 DIAGNOSIS — Z791 Long term (current) use of non-steroidal anti-inflammatories (NSAID): Secondary | ICD-10-CM | POA: Diagnosis not present

## 2020-01-25 DIAGNOSIS — E669 Obesity, unspecified: Secondary | ICD-10-CM | POA: Diagnosis not present

## 2020-01-25 DIAGNOSIS — E039 Hypothyroidism, unspecified: Secondary | ICD-10-CM | POA: Insufficient documentation

## 2020-01-25 NOTE — Patient Instructions (Signed)
The biopsy taken at your GYN office is only showing endocervical cells so she is recommending a hysterectomy since we do not have an assessment of the endometrium (lining of the uterus).  We are working on the surgery schedule for next year. You can contact our office when you are ready to schedule your surgery. We will arrange for a pre-op appt in the office with Joylene John NP prior to your surgery.  Preparing for your Surgery  You will be scheduled for a robotic assisted total laparoscopic hysterectomy, unilateral salpingo-oophorectomy, possible bilateral salpingo-oophorectomy, possible staging.

## 2020-01-25 NOTE — Progress Notes (Signed)
Consult Note: Gyn-Onc  Consult was requested by Dr. Landry Mellow for the evaluation of Diamond Austin 39 y.o. female  CC:  Chief Complaint  Patient presents with  . Ovarian mass    Assessment/Plan:  [name]  is a 39 y.o.  year old with a large ovarian cystic mass most consistent with a dermoid on imaging, in the setting of very mildly elevated premenopausal ROMA score.  She has abnormal uterine bleeding and nondiagnostic sampling in the office.  I am recommending robotic assisted total hysterectomy with unilateral salpingo-oophorectomy, possible BSO and staging.  I explained that overall I have a low suspicion for malignancy, however there is no way to know for certain prior to surgical removal of the ovary.  Given the slightly complex features of the cyst and the abnormal tumor marker profile, this is reasonable.  Additionally due to the nondiagnostic work-up of her endometrium in the office in the setting of abnormal uterine bleeding, she requires endometrial assessment.  She is completed childbearing and is excepting of permanent infertility that hysterectomy would create.  I explained that there would be benefit in retaining her contralateral ovary should pathology from surgery be benign given her young premenopausal age.  Patient strongly desires waiting until the new year to proceed with surgical scheduling.  I think that this is safe given the reassuring imaging and tumor marker profiles overall.  She was however offered a date sooner, in November, but declined this.  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  HPI: Ms Diamond Austin is a 39 year old P3 who was seen in consultation at the request of Dr Landry Mellow for evaluation of a left ovarian cystic mass and abnormal uterine bleeding.  The patient reported having a change in her normal menstrual cycle since July 2021.  She sought evaluation from her primary care doctor in September 2021  and an ultrasound scan was performed to work this up.  This was performed on 12/31/2019 and revealed a uterus measuring 10.9 x 4.6 x 6.1 cm with a 9 mm endometrial thickness.  The right ovary measured 3.4 x 1.8 x 1.3 cm and was normal.  The left ovary measured 9.1 x 6 x 6.1 cm and contains a large echo genic complex mass measuring 6.7 x 5.4 x 7 cm.  The findings were potential for a dermoid.  She was sent to Dr. Landry Mellow for further evaluation.  Endometrial biopsy was performed by Dr. Landry Mellow on January 15, 2020 and this revealed menstrual type endometrium with separate minute fragments of benign endocervical glands and mucus.  No hyperplasia or carcinoma or endometritis.  Ca1 25 and HD4 were drawn that same day on January 15, 2020 as part of a Roma score.  This resulted with a normal Ca1 25 at 10.1, normal HD4 at 61.9, but a very slightly elevated premenopausal Roma score at 1.15 (upper limit of normal being 1.14). Her medical history is most significant for obesity, hypothyroidism, hypercholesterolemia, tobacco use (6 to 8 cigarettes/day).  Her surgical history is most significant for a tubal ligation for permanent sterilization.  Her gynecologic history is remarkable for 3 prior vaginal deliveries.  No prior history of abnormal Pap tests or HPV.  Her family cancer history is unremarkable.  She works as Web designer. She lives with her 3 children.  She travels to the office 2-3 times per week.   Current Meds:  Outpatient Encounter Medications as of 01/25/2020  Medication Sig  . calcium-vitamin D (OSCAL WITH D) 250-125 MG-UNIT per tablet Take 1 tablet by mouth daily.  Marland Kitchen ibuprofen (ADVIL,MOTRIN) 600 MG tablet Take 1 tablet (600 mg total) by mouth every 6 (six) hours.  . naproxen (NAPROSYN) 500 MG tablet Take 500 mg by mouth 2 (two) times daily.  . psyllium (METAMUCIL) 58.6 % packet Take 1 packet by mouth daily.  . Vitamin D, Ergocalciferol, (DRISDOL) 1.25 MG (50000 UNIT) CAPS capsule Take 1  capsule by mouth once a week.  . [DISCONTINUED] oxyCODONE-acetaminophen (ROXICET) 5-325 MG tablet Take 1 tablet by mouth every 6 (six) hours as needed for severe pain.  . [DISCONTINUED] Prenatal Vit-Fe Fumarate-FA (PRENATAL MULTIVITAMIN) TABS Take 1 tablet by mouth daily.   No facility-administered encounter medications on file as of 01/25/2020.    Allergy: No Known Allergies  Social Hx:   Social History   Socioeconomic History  . Marital status: Married    Spouse name: Not on file  . Number of children: Not on file  . Years of education: Not on file  . Highest education level: Not on file  Occupational History  . Not on file  Tobacco Use  . Smoking status: Current Every Day Smoker    Packs/day: 0.25    Years: 8.00    Pack years: 2.00    Types: Cigarettes  . Smokeless tobacco: Never Used  Substance and Sexual Activity  . Alcohol use: No  . Drug use: No  . Sexual activity: Yes    Birth control/protection: None  Other Topics Concern  . Not on file  Social History Narrative  . Not on file   Social Determinants of Health   Financial Resource Strain:   . Difficulty of Paying Living Expenses: Not on file  Food Insecurity:   . Worried About Charity fundraiser in the Last Year: Not on file  . Ran Out of Food in the Last Year: Not on file  Transportation Needs:   . Lack of Transportation (Medical): Not on file  . Lack of Transportation (Non-Medical): Not on file  Physical Activity:   . Days of Exercise per Week: Not on file  . Minutes of Exercise per Session: Not on file  Stress:   . Feeling of Stress : Not on file  Social Connections:   . Frequency of Communication with Friends and Family: Not on file  . Frequency of Social Gatherings with Friends and Family: Not on file  . Attends Religious Services: Not on file  . Active Member of Clubs or Organizations: Not on file  . Attends Archivist Meetings: Not on file  . Marital Status: Not on file  Intimate  Partner Violence:   . Fear of Current or Ex-Partner: Not on file  . Emotionally Abused: Not on file  . Physically Abused: Not on file  . Sexually Abused: Not on file    Past Surgical Hx:  Past Surgical History:  Procedure Laterality Date  . TUBAL LIGATION Bilateral 04/14/2015   Procedure: POST PARTUM TUBAL LIGATION;  Surgeon: Lavonia Drafts, MD;  Location: [facility];  Service: Gynecology;  Laterality: Bilateral;  . VAGINAL DELIVERY     X 1    Past Medical Hx:  Past Medical History:  Diagnosis Date  . Elevated liver function tests    2011  . Hypercholesteremia     Past Gynecological History: See HPI No LMP recorded. (Menstrual status: Other).  Family Hx:  Family History  Problem Relation Age of Onset  .  Osteoporosis Mother   . Hypertension Father   . Hypertension Brother   . Arthritis Paternal Grandmother     Review of Systems:  Constitutional  Feels well,    ENT Normal appearing ears and nares bilaterally Skin/Breast  No rash, sores, jaundice, itching, dryness Cardiovascular  No chest pain, shortness of breath, or edema  Pulmonary  No cough or wheeze.  Gastro Intestinal  No nausea, vomitting, or diarrhoea. No bright red blood per rectum, no abdominal pain, change in bowel movement, or constipation.  Genito Urinary  No frequency, urgency, dysuria, increased frequency of menses since July 2021 Musculo Skeletal  No myalgia, arthralgia, joint swelling or pain  Neurologic  No weakness, numbness, change in gait,  Psychology  No depression, anxiety, insomnia.   Vitals:  Blood pressure 136/84, pulse 81, temperature 98.7 F (37.1 C), temperature source Tympanic, resp. rate 18, height 5\' 3"  (1.6 m), weight 191 lb 3.2 oz (86.7 kg), SpO2 100 %, not currently breastfeeding.  Physical Exam: WD in NAD Neck  Supple NROM, without any enlargements.  Lymph Node Survey No cervical supraclavicular or inguinal adenopathy Cardiovascular  Pulse normal rate, regularity  and rhythm. S1 and S2 normal.  Lungs  Clear to auscultation bilateraly, without wheezes/crackles/rhonchi. Good air movement.  Skin  No rash/lesions/breakdown  Psychiatry  Alert and oriented to person, place, and time  Abdomen  Normoactive bowel sounds, abdomen soft, non-tender and obese without evidence of hernia.  Back No CVA tenderness Genito Urinary  Vulva/vagina: Normal external female genitalia.   No lesions. No discharge or bleeding.  Bladder/urethra:  No lesions or masses, well supported bladder  Vagina: Grossly normal  Cervix: Normal appearing, no lesions.  Uterus:  Small, mobile, no parametrial involvement or nodularity.  Adnexa: Smooth round full left pelvic adnexal mass which was mobile. Rectal  Good tone, no masses no cul de sac nodularity.  Extremities  No bilateral cyanosis, clubbing or edema.  60 minutes of total time was spent for this patient encounter, including preparation, face-to-face counseling with the patient and coordination of care, review of imaging (results and images), communication with the referring provider and documentation of the encounter.   Thereasa Solo, MD  01/25/2020, 10:45 AM

## 2020-02-03 ENCOUNTER — Other Ambulatory Visit: Payer: Self-pay | Admitting: Family Medicine

## 2020-02-03 DIAGNOSIS — Z0000 Encounter for general adult medical examination without abnormal findings: Secondary | ICD-10-CM

## 2020-02-05 ENCOUNTER — Telehealth: Payer: Self-pay | Admitting: Gynecologic Oncology

## 2020-02-05 NOTE — Telephone Encounter (Signed)
Returned call to patient to follow-up on scheduling surgery.  Unable to leave a message.  Will reattempt call at a later time.

## 2020-02-08 ENCOUNTER — Telehealth: Payer: Self-pay

## 2020-02-08 NOTE — Telephone Encounter (Signed)
TC from patient.  Patient wants surgery in January 2/2 to work schedule.  Patient will call back end of November or early December to schedule preop for surgery in January.

## 2020-02-24 ENCOUNTER — Telehealth: Payer: Self-pay

## 2020-02-24 NOTE — Telephone Encounter (Signed)
Told her available dates for surgery. She chose January 20-22.  Told her that she will receive a call from pre-surgical testing 7-10 days prior to her surgery to set up pr-op appointments. Pt verbalized understanding. Surgery date chosen given to [employer].

## 2020-03-01 ENCOUNTER — Encounter: Payer: BC Managed Care – PPO | Admitting: Gynecologic Oncology

## 2020-03-01 ENCOUNTER — Other Ambulatory Visit: Payer: Self-pay | Admitting: Gynecologic Oncology

## 2020-03-01 DIAGNOSIS — N939 Abnormal uterine and vaginal bleeding, unspecified: Secondary | ICD-10-CM

## 2020-03-01 DIAGNOSIS — N838 Other noninflammatory disorders of ovary, fallopian tube and broad ligament: Secondary | ICD-10-CM

## 2020-03-14 ENCOUNTER — Other Ambulatory Visit: Payer: Self-pay

## 2020-03-14 ENCOUNTER — Ambulatory Visit
Admission: RE | Admit: 2020-03-14 | Discharge: 2020-03-14 | Disposition: A | Discharge status: Home / Self Care | Payer: BC Managed Care – PPO | Source: Ambulatory Visit | Attending: Family Medicine | Admitting: Family Medicine

## 2020-03-14 DIAGNOSIS — Z0000 Encounter for general adult medical examination without abnormal findings: Secondary | ICD-10-CM

## 2020-03-14 DIAGNOSIS — Z1231 Encounter for screening mammogram for malignant neoplasm of breast: Secondary | ICD-10-CM | POA: Diagnosis not present

## 2020-03-17 ENCOUNTER — Other Ambulatory Visit: Payer: Self-pay | Admitting: Family Medicine

## 2020-03-17 DIAGNOSIS — R928 Other abnormal and inconclusive findings on diagnostic imaging of breast: Secondary | ICD-10-CM

## 2020-03-31 ENCOUNTER — Other Ambulatory Visit: Payer: BC Managed Care – PPO

## 2020-04-08 ENCOUNTER — Telehealth: Payer: Self-pay

## 2020-04-08 NOTE — Telephone Encounter (Signed)
Diamond Austin will bring FMLA and Disability forms to pre-op visit on 04-15-20.

## 2020-04-11 DIAGNOSIS — Z713 Dietary counseling and surveillance: Secondary | ICD-10-CM | POA: Diagnosis not present

## 2020-04-13 NOTE — Patient Instructions (Addendum)
DUE TO COVID-19 ONLY ONE VISITOR IS ALLOWED TO COME WITH YOU AND STAY IN THE WAITING ROOM ONLY DURING PRE OP AND PROCEDURE DAY OF SURGERY. THE 1 VISITOR  MAY VISIT WITH YOU AFTER SURGERY IN YOUR PRIVATE ROOM DURING VISITING HOURS ONLY!  YOU NEED TO HAVE A COVID 19 TEST ON_1/18______ @__10 :25_____, THIS TEST MUST BE DONE BEFORE SURGERY,  COVID TESTING SITE [address] [place] [address], IT IS ON THE RIGHT GOING OUT [address] APPROXIMATELY  2 MINUTES PAST [employer] ON THE RIGHT. ONCE YOUR COVID TEST IS COMPLETED,  PLEASE BEGIN THE QUARANTINE INSTRUCTIONS AS OUTLINED IN YOUR HANDOUT.                VENDETTA PITTINGER    Your procedure is scheduled on: 04/21/20   Report to [facility] Main  Entrance   Report to admitting at   5:45 AM     Call this number if you have problems the morning of surgery [phone]    Remember: Do not eat food or drink liquids :After Midnight.   BRUSH YOUR TEETH MORNING OF SURGERY AND RINSE YOUR MOUTH OUT, NO CHEWING GUM CANDY OR MINTS.     Take these medicines the morning of surgery with A SIP OF WATER: None                                 You may not have any metal on your body including hair pins and              piercings  Do not wear jewelry, make-up, lotions, powders or perfumes, deodorant             Do not wear nail polish on your fingernails.  Do not shave  48 hours prior to surgery.     Do not bring valuables to the hospital. [place].  Contacts, dentures or bridgework may not be worn into surgery.       Patients discharged the day of surgery will not be allowed to drive home.  IF YOU ARE HAVING SURGERY AND GOING HOME THE SAME DAY, YOU MUST HAVE AN ADULT TO DRIVE YOU HOME AND BE WITH YOU FOR 24 HOURS.  YOU MAY GO HOME BY TAXI OR UBER OR ORTHERWISE, BUT AN ADULT MUST ACCOMPANY YOU HOME AND STAY WITH YOU FOR 24 HOURS.  Name and phone number of your  driver:  Special Instructions: N/A              Please read over the following fact sheets you were given: _____________________________________________________________________   [facility] - Preparing for Surgery Before surgery, you can play an important role.  Because skin is not sterile, your skin needs to be as free of germs as possible.  You can reduce the number of germs on your skin by washing with CHG (chlorahexidine gluconate) soap before surgery.  CHG is an antiseptic cleaner which kills germs and bonds with the skin to continue killing germs even after washing. Please DO NOT use if you have an allergy to CHG or antibacterial soaps.  If your skin becomes reddened/irritated stop using the CHG and inform your nurse when you arrive at Short Stay. Do not shave (including legs and underarms) for at least 48 hours prior to the first CHG  shower.  You may shave your face/neck. Please follow these instructions carefully:  1.  Shower with CHG Soap the night before surgery and the  morning of Surgery.  2.  If you choose to wash your hair, wash your hair first as usual with your  normal  shampoo.  3.  After you shampoo, rinse your hair and body thoroughly to remove the  shampoo.                                        4.  Use CHG as you would any other liquid soap.  You can apply chg directly  to the skin and wash                       Gently with a scrungie or clean washcloth.  5.  Apply the CHG Soap to your body ONLY FROM THE NECK DOWN.   Do not use on face/ open                           Wound or open sores. Avoid contact with eyes, ears mouth and genitals (private parts).                       Wash face,  Genitals (private parts) with your normal soap.             6.  Wash thoroughly, paying special attention to the area where your surgery  will be performed.  7.  Thoroughly rinse your body with warm water from the neck down.  8.  DO NOT shower/wash with your normal soap after using and  rinsing off  the CHG Soap.                9.  Pat yourself dry with a clean towel.            10.  Wear clean pajamas.            11.  Place clean sheets on your bed the night of your first shower and do not  sleep with pets. Day of Surgery : Do not apply any lotions/deodorants the morning of surgery.  Please wear clean clothes to the hospital/surgery center.  FAILURE TO FOLLOW THESE INSTRUCTIONS MAY RESULT IN THE CANCELLATION OF YOUR SURGERY PATIENT SIGNATURE_________________________________  NURSE SIGNATURE__________________________________  ________________________________________________________________________                WHAT IS A BLOOD TRANSFUSION? Blood Transfusion Information  A transfusion is the replacement of blood or some of its parts. Blood is made up of multiple cells which provide different functions.  Red blood cells carry oxygen and are used for blood loss replacement.  White blood cells fight against infection.  Platelets control bleeding.  Plasma helps clot blood.  Other blood products are available for specialized needs, such as hemophilia or other clotting disorders. BEFORE THE TRANSFUSION  Who gives blood for transfusions?   Healthy volunteers who are fully evaluated to make sure their blood is safe. This is blood bank blood. Transfusion therapy is the safest it has ever been in the practice of medicine. Before blood is taken from a donor, a complete history is taken to make sure that person has no history of diseases nor engages in risky social behavior (examples are intravenous drug use  or sexual activity with multiple partners). The donor's travel history is screened to minimize risk of transmitting infections, such as malaria. The donated blood is tested for signs of infectious diseases, such as HIV and hepatitis. The blood is then tested to be sure it is compatible with you in order to minimize the chance of a transfusion reaction. If you or a  relative donates blood, this is often done in anticipation of surgery and is not appropriate for emergency situations. It takes many days to process the donated blood. RISKS AND COMPLICATIONS Although transfusion therapy is very safe and saves many lives, the main dangers of transfusion include:   Getting an infectious disease.  Developing a transfusion reaction. This is an allergic reaction to something in the blood you were given. Every precaution is taken to prevent this. The decision to have a blood transfusion has been considered carefully by your caregiver before blood is given. Blood is not given unless the benefits outweigh the risks. AFTER THE TRANSFUSION  Right after receiving a blood transfusion, you will usually feel much better and more energetic. This is especially true if your red blood cells have gotten low (anemic). The transfusion raises the level of the red blood cells which carry oxygen, and this usually causes an energy increase.  The nurse administering the transfusion will monitor you carefully for complications. HOME CARE INSTRUCTIONS  No special instructions are needed after a transfusion. You may find your energy is better. Speak with your caregiver about any limitations on activity for underlying diseases you may have. SEEK MEDICAL CARE IF:   Your condition is not improving after your transfusion.  You develop redness or irritation at the intravenous (IV) site. SEEK IMMEDIATE MEDICAL CARE IF:  Any of the following symptoms occur over the next 12 hours:  Shaking chills.  You have a temperature by mouth above 102 F (38.9 C), not controlled by medicine.  Chest, back, or muscle pain.  People around you feel you are not acting correctly or are confused.  Shortness of breath or difficulty breathing.  Dizziness and fainting.  You get a rash or develop hives.  You have a decrease in urine output.  Your urine turns a dark color or changes to pink, red, or  brown. Any of the following symptoms occur over the next 10 days:  You have a temperature by mouth above 102 F (38.9 C), not controlled by medicine.  Shortness of breath.  Weakness after normal activity.  The white part of the eye turns yellow (jaundice).  You have a decrease in the amount of urine or are urinating less often.  Your urine turns a dark color or changes to pink, red, or brown. Document Released: 03/16/2000 Document Revised: 06/11/2011 Document Reviewed: 11/03/2007 [facility] Patient Information 2014 [place], [place].  _______________________________________________________________________

## 2020-04-14 NOTE — Patient Instructions (Signed)
Preparing for your Surgery  Plan for surgery on April 21, 2020 with Dr. Everitt Amber at [place] will be scheduled for a robotic assisted total hysterectomy (removal of uterus and cervix) with unilateral salpingo-oophorectomy (removal of one tube and ovary), possible bilateral salpingo-oophorectomy (removal of both ovaries and fallopian tubes) and staging if cancer is identified.   Given the current surge of COVID cases in the hospital, there is a chance that your surgery may be postponed. Currently, you are still scheduled. We will update you on any changes as soon as we are aware.  Pre-operative Testing -(DONE) You will receive a phone call from presurgical testing at [facility] to arrange for a pre-operative appointment, lab appointment, and COVID test. The COVID test normally happens 3 days prior to the surgery and they ask that you self quarantine after the test up until surgery to decrease chance of exposure.  -Bring your insurance card, copy of an advanced directive if applicable, medication list  -At that visit, you will be asked to sign a consent for a possible blood transfusion in case a transfusion becomes necessary during surgery.  The need for a blood transfusion is rare but having consent is a necessary part of your care.     -You should not be taking blood thinners or aspirin at least ten days prior to surgery unless instructed by your surgeon.  -Do not take supplements such as fish oil (omega 3), red yeast rice, turmeric before your surgery.   Day Before Surgery at [place] will be asked to take in a light diet the day before surgery. You will be advised you can have clear liquids up until 3 hours before your surgery.    Eat a light diet the day before surgery.  Examples including soups, broths, toast, yogurt, mashed potatoes.  AVOID GAS PRODUCING FOODS. Things to avoid include carbonated beverages (fizzy beverages, sodas), raw fruits and raw vegetables  (uncooked), or beans.   If your bowels are filled with gas, your surgeon will have difficulty visualizing your pelvic organs which increases your surgical risks.  Your role in recovery Your role is to become active as soon as directed by your doctor, while still giving yourself time to heal.  Rest when you feel tired. You will be asked to do the following in order to speed your recovery:  - Cough and breathe deeply. This helps to clear and expand your lungs and can prevent pneumonia after surgery.  - [place]. Do mild physical activity. Walking or moving your legs help your circulation and body functions return to normal. Do not try to get up or walk alone the first time after surgery.   -If you develop swelling on one leg or the other, pain in the back of your leg, redness/warmth in one of your legs, please call the office or go to the Emergency Room to have a doppler to rule out a blood clot. For shortness of breath, chest pain-seek care in the Emergency Room as soon as possible. - Actively manage your pain. Managing your pain lets you move in comfort. We will ask you to rate your pain on a scale of zero to 10. It is your responsibility to tell your doctor or nurse where and how much you hurt so your pain can be treated.  Special Considerations -If you are diabetic, you may be placed on insulin after surgery to have closer control over your blood sugars to  promote healing and recovery.  This does not mean that you will be discharged on insulin.  If applicable, your oral antidiabetics will be resumed when you are tolerating a solid diet.  -Your final pathology results from surgery should be available around one week after surgery and the results will be relayed to you when available.  -Dr. Antionette Char is the surgeon that assists your GYN Oncologist with surgery.  If you end up staying the night, the next day after your surgery you will either see Dr. Andrey Farmer, Dr. Pricilla Holm,  or Dr. Antionette Char.  -FMLA forms can be faxed to [phone] and please allow 5-7 business days for completion.  Pain Management After Surgery -You have been prescribed your pain medication and bowel regimen medications before surgery so that you can have these available when you are discharged from the hospital. The pain medication is for use ONLY AFTER surgery and a new prescription will not be given.   -Make sure that you have Tylenol and Ibuprofen at home to use on a regular basis after surgery for pain control. We recommend alternating the medications every hour to six hours since they work differently and are processed in the body differently for pain relief.  -Review the attached handout on narcotic use and their risks and side effects.   Bowel Regimen -You have been prescribed Sennakot-S to take nightly to prevent constipation especially if you are taking the narcotic pain medication intermittently.  It is important to prevent constipation and drink adequate amounts of liquids. You can stop taking this medication when you are not taking pain medication and you are back on your normal bowel routine.  Risks of Surgery Risks of surgery are low but include bleeding, infection, damage to surrounding structures, re-operation, blood clots, and very rarely death.   Blood Transfusion Information (For the consent to be signed before surgery)  We will be checking your blood type before surgery so in case of emergencies, we will know what type of blood you would need.                                            WHAT IS A BLOOD TRANSFUSION?  A transfusion is the replacement of blood or some of its parts. Blood is made up of multiple cells which provide different functions.  Red blood cells carry oxygen and are used for blood loss replacement.  White blood cells fight against infection.  Platelets control bleeding.  Plasma helps clot blood.  Other blood products are available for  specialized needs, such as hemophilia or other clotting disorders. BEFORE THE TRANSFUSION  Who gives blood for transfusions?   You may be able to donate blood to be used at a later date on yourself (autologous donation).  Relatives can be asked to donate blood. This is generally not any safer than if you have received blood from a stranger. The same precautions are taken to ensure safety when a relative's blood is donated.  Healthy volunteers who are fully evaluated to make sure their blood is safe. This is blood bank blood. Transfusion therapy is the safest it has ever been in the practice of medicine. Before blood is taken from a donor, a complete history is taken to make sure that person has no history of diseases nor engages in risky social behavior (examples are intravenous drug use or sexual activity with  multiple partners). The donor's travel history is screened to minimize risk of transmitting infections, such as malaria. The donated blood is tested for signs of infectious diseases, such as HIV and hepatitis. The blood is then tested to be sure it is compatible with you in order to minimize the chance of a transfusion reaction. If you or a relative donates blood, this is often done in anticipation of surgery and is not appropriate for emergency situations. It takes many days to process the donated blood. RISKS AND COMPLICATIONS Although transfusion therapy is very safe and saves many lives, the main dangers of transfusion include:   Getting an infectious disease.  Developing a transfusion reaction. This is an allergic reaction to something in the blood you were given. Every precaution is taken to prevent this. The decision to have a blood transfusion has been considered carefully by your caregiver before blood is given. Blood is not given unless the benefits outweigh the risks.  AFTER SURGERY INSTRUCTIONS  Return to work: 4 weeks if applicable  Activity: 1. Be up and out of the bed  during the day.  Take a nap if needed.  You may walk up steps but be careful and use the hand rail.  Stair climbing will tire you more than you think, you may need to stop part way and rest.   2. No lifting or straining for 4 weeks over 10 pounds. Do not pick up anything that would require two hands. No pushing, pulling, straining for 4 weeks.  3. No driving for 1 week(s).  Do not drive if you are taking narcotic pain medicine and make sure that your reaction time has returned.   4. You can shower as soon as the next day after surgery. Shower daily.  Use your regular soap and water (not directly on the incision) and pat your incision(s) dry afterwards; don't rub.  No tub baths or submerging your body in water until cleared by your surgeon. If you have the soap that was given to you by pre-surgical testing that was used before surgery, you do not need to use it afterwards because this can irritate your incisions.   5. No sexual activity and nothing in the vagina for 8 weeks.  6. You may experience a small amount of clear drainage from your incisions, which is normal.  If the drainage persists, increases, or changes color please call the office.  7. Do not use creams, lotions, or ointments such as neosporin on your incisions after surgery until advised by your surgeon because they can cause removal of the dermabond glue on your incisions.    8. You may experience vaginal spotting after surgery or around the 6-8 week mark from surgery when the stitches at the top of the vagina begin to dissolve.  The spotting is normal but if you experience heavy bleeding, call our office.  9. Take Tylenol or ibuprofen first for pain and only use narcotic pain medication for severe pain not relieved by the Tylenol or Ibuprofen.  Monitor your Tylenol intake to a max of 4,000 mg in a 24 hour period. You can alternate these medications after surgery.  Diet: 1. Low sodium Heart Healthy Diet is recommended but you are  cleared to resume your normal (before surgery) diet after your procedure.  2. It is safe to use a laxative, such as Miralax or Colace, if you have difficulty moving your bowels. You have been prescribed Sennakot at bedtime every evening to keep bowel movements regular  and to prevent constipation.    Wound Care: 1. Keep clean and dry.  Shower daily.  Reasons to call the Doctor:  Fever - Oral temperature greater than 100.4 degrees Fahrenheit  Foul-smelling vaginal discharge  Difficulty urinating  Nausea and vomiting  Increased pain at the site of the incision that is unrelieved with pain medicine.  Difficulty breathing with or without chest pain  New calf pain especially if only on one side  Sudden, continuing increased vaginal bleeding with or without clots.   Contacts: For questions or concerns you should contact:  Dr. Adolphus Birchwood at [phone]  Warner Mccreedy, NP at [phone]  After Hours: call [phone] and have the GYN Oncologist paged/contacted (after 5 pm or on the weekends).  Messages sent via mychart are for non-urgent matters and are not responded to after hours so for urgent needs, please call the after hours number.

## 2020-04-14 NOTE — Progress Notes (Unsigned)
Patient here for a pre-operative appointment prior to her scheduled surgery on April 21, 2020. She is scheduled for a robotic assisted total hysterectomy with unilateral salpingo-oophorectomy, possible BSO and staging if cancer is identified. .  She had her pre-admission testing appointment this am at [facility].  The surgery was discussed in detail.  See after visit summary for additional details. Visual aids used to discuss items related to surgery including the incentive spirometer, sequential compression stockings, foley catheter, IV pump, multi-modal pain regimen including tylenol, photo of the surgical robot, female reproductive system to discuss surgery in detail.      Discussed post-op pain management in detail including the aspects of the enhanced recovery pathway.  Advised her that a new prescription would be sent in for Oxycodone and it is only to be used for after her upcoming surgery.  We discussed the use of tylenol post-op and to monitor for a maximum of 4,000 mg in a 24 hour period.  Also prescribed sennakot to be used after surgery and to hold if having loose stools.  Discussed bowel regimen in detail.     Discussed the use of lovenox pre-op, SCDs, and measures to take at home to prevent DVT including frequent mobility.  Reportable signs and symptoms of DVT discussed. Post-operative instructions discussed and expectations for after surgery. Incisional care discussed as well including reportable signs and symptoms including erythema, drainage, wound separation.     20 minutes spent with the patient.  Verbalizing understanding of material discussed. No needs or concerns voiced at the end of the visit.   Advised patient and family to call for any needs.  Advised that her post-operative medications had been prescribed and could be picked up at any time.

## 2020-04-15 ENCOUNTER — Other Ambulatory Visit: Payer: Self-pay

## 2020-04-15 ENCOUNTER — Encounter (HOSPITAL_COMMUNITY)
Admission: RE | Admit: 2020-04-15 | Discharge: 2020-04-15 | Disposition: A | Discharge status: Home / Self Care | Payer: BC Managed Care – PPO | Source: Ambulatory Visit | Attending: Gynecologic Oncology | Admitting: Gynecologic Oncology

## 2020-04-15 ENCOUNTER — Inpatient Hospital Stay: Payer: BC Managed Care – PPO | Attending: Gynecologic Oncology

## 2020-04-15 ENCOUNTER — Encounter (HOSPITAL_COMMUNITY): Payer: Self-pay

## 2020-04-15 VITALS — BP 145/90 | HR 58 | Temp 96.8°F | Resp 18 | Ht 63.0 in | Wt 194.0 lb

## 2020-04-15 DIAGNOSIS — N939 Abnormal uterine and vaginal bleeding, unspecified: Secondary | ICD-10-CM

## 2020-04-15 DIAGNOSIS — E119 Type 2 diabetes mellitus without complications: Secondary | ICD-10-CM | POA: Diagnosis not present

## 2020-04-15 DIAGNOSIS — N838 Other noninflammatory disorders of ovary, fallopian tube and broad ligament: Secondary | ICD-10-CM

## 2020-04-15 DIAGNOSIS — Z01818 Encounter for other preprocedural examination: Secondary | ICD-10-CM | POA: Insufficient documentation

## 2020-04-15 LAB — COMPREHENSIVE METABOLIC PANEL
ALT: 20 U/L (ref 0–44)
AST: 23 U/L (ref 15–41)
Albumin: 3.8 g/dL (ref 3.5–5.0)
Alkaline Phosphatase: 65 U/L (ref 38–126)
Anion gap: 7 (ref 5–15)
BUN: 11 mg/dL (ref 6–20)
CO2: 25 mmol/L (ref 22–32)
Calcium: 9.2 mg/dL (ref 8.9–10.3)
Chloride: 107 mmol/L (ref 98–111)
Creatinine, Ser: 0.73 mg/dL (ref 0.44–1.00)
GFR, Estimated: >60 mL/min (ref >60)
Glucose, Bld: 95 mg/dL (ref 70–99)
Potassium: 4.1 mmol/L (ref 3.5–5.1)
Sodium: 139 mmol/L (ref 135–145)
Total Bilirubin: 0.6 mg/dL (ref 0.3–1.2)
Total Protein: 7.4 g/dL (ref 6.5–8.1)

## 2020-04-15 LAB — URINALYSIS, ROUTINE W REFLEX MICROSCOPIC
Bilirubin Urine: NEGATIVE
Glucose, UA: NEGATIVE mg/dL
Hgb urine dipstick: NEGATIVE
Ketones, ur: NEGATIVE mg/dL
Leukocytes,Ua: NEGATIVE
Nitrite: NEGATIVE
Protein, ur: NEGATIVE mg/dL
Specific Gravity, Urine: 1.013 (ref 1.005–1.030)
pH: 8 (ref 5.0–8.0)

## 2020-04-15 LAB — CBC
HCT: 36.1 % (ref 36.0–46.0)
Hemoglobin: 12.4 g/dL (ref 12.0–15.0)
MCH: 29.2 pg (ref 26.0–34.0)
MCHC: 34.3 g/dL (ref 30.0–36.0)
MCV: 85.1 fL (ref 80.0–100.0)
Platelets: 282 10*3/uL (ref 150–400)
RBC: 4.24 MIL/uL (ref 3.87–5.11)
RDW: 12.5 % (ref 11.5–15.5)
WBC: 4.9 10*3/uL (ref 4.0–10.5)
nRBC: 0 % (ref 0.0–0.2)

## 2020-04-15 LAB — HEMOGLOBIN A1C
Hgb A1c MFr Bld: 5.5 % (ref 4.8–5.6)
Mean Plasma Glucose: 111.15 mg/dL

## 2020-04-15 MED ORDER — IBUPROFEN 800 MG PO TABS: 800.0000 mg | ORAL_TABLET | Freq: Three times a day (TID) | ORAL | 0 refills | Status: DC | PRN

## 2020-04-15 MED ORDER — OXYCODONE HCL 5 MG PO TABS: 5.0000 mg | ORAL_TABLET | ORAL | 0 refills | Status: DC | PRN

## 2020-04-15 MED ORDER — SENNOSIDES-DOCUSATE SODIUM 8.6-50 MG PO TABS: 2.0000 | ORAL_TABLET | Freq: Every day | ORAL | 0 refills | Status: AC

## 2020-04-15 NOTE — Progress Notes (Signed)
COVID Vaccine Completed:yes Date COVID Vaccine completed:08/05/19-booster 04/15/19 COVID vaccine manufacturer: Pfizer     PCP - Dr. Garlon Hatchet Cardiologist - none  Chest x-ray - no EKG - 04/16/19-chart, epic Stress Test - no ECHO - no Cardiac Cath - no Pacemaker/ICD device last checked:NA  Sleep Study - na CPAP -   Fasting Blood Sugar - NA Checks Blood Sugar _____ times a day  Blood Thinner Instructions:NA Aspirin Instructions: Last Dose:  Anesthesia review:   Patient denies shortness of breath, fever, cough and chest pain at PAT appointment yes  Patient verbalized understanding of instructions that were given to them at the PAT appointment. Patient was also instructed that they will need to review over the PAT instructions again at home before surgery. Pt reports no SOB with activities

## 2020-04-19 ENCOUNTER — Other Ambulatory Visit (HOSPITAL_COMMUNITY)
Admission: RE | Admit: 2020-04-19 | Discharge: 2020-04-19 | Disposition: A | Discharge status: Home / Self Care | Payer: BC Managed Care – PPO | Source: Ambulatory Visit | Attending: Gynecologic Oncology | Admitting: Gynecologic Oncology

## 2020-04-19 DIAGNOSIS — Z20822 Contact with and (suspected) exposure to covid-19: Secondary | ICD-10-CM | POA: Insufficient documentation

## 2020-04-19 DIAGNOSIS — F17210 Nicotine dependence, cigarettes, uncomplicated: Secondary | ICD-10-CM | POA: Diagnosis not present

## 2020-04-19 DIAGNOSIS — Z01812 Encounter for preprocedural laboratory examination: Secondary | ICD-10-CM | POA: Insufficient documentation

## 2020-04-19 DIAGNOSIS — Z79899 Other long term (current) drug therapy: Secondary | ICD-10-CM | POA: Diagnosis not present

## 2020-04-19 DIAGNOSIS — Z8262 Family history of osteoporosis: Secondary | ICD-10-CM | POA: Diagnosis not present

## 2020-04-19 DIAGNOSIS — D271 Benign neoplasm of left ovary: Secondary | ICD-10-CM | POA: Diagnosis not present

## 2020-04-19 DIAGNOSIS — Z8249 Family history of ischemic heart disease and other diseases of the circulatory system: Secondary | ICD-10-CM | POA: Diagnosis not present

## 2020-04-19 DIAGNOSIS — N939 Abnormal uterine and vaginal bleeding, unspecified: Secondary | ICD-10-CM | POA: Diagnosis not present

## 2020-04-19 DIAGNOSIS — Z8261 Family history of arthritis: Secondary | ICD-10-CM | POA: Diagnosis not present

## 2020-04-19 DIAGNOSIS — Z791 Long term (current) use of non-steroidal anti-inflammatories (NSAID): Secondary | ICD-10-CM | POA: Diagnosis not present

## 2020-04-19 DIAGNOSIS — R7303 Prediabetes: Secondary | ICD-10-CM | POA: Diagnosis not present

## 2020-04-19 DIAGNOSIS — N800 Endometriosis of uterus: Secondary | ICD-10-CM | POA: Diagnosis not present

## 2020-04-19 DIAGNOSIS — N8301 Follicular cyst of right ovary: Secondary | ICD-10-CM | POA: Diagnosis not present

## 2020-04-19 LAB — SARS CORONAVIRUS 2 (TAT 6-24 HRS): SARS Coronavirus 2: NEGATIVE

## 2020-04-20 ENCOUNTER — Encounter (HOSPITAL_COMMUNITY): Payer: Self-pay | Admitting: Gynecologic Oncology

## 2020-04-20 ENCOUNTER — Telehealth: Payer: Self-pay

## 2020-04-20 NOTE — Telephone Encounter (Signed)
LM for Ms Diamond Austin to call the office at [phone] if she has any questions or concerns regarding her written pre-op instructions from 04-15-20.

## 2020-04-20 NOTE — Anesthesia Preprocedure Evaluation (Addendum)
Anesthesia Evaluation  Patient identified by MRN, date of birth, ID band Patient awake    Reviewed: Allergy & Precautions, NPO status , Patient's Chart, lab work & pertinent test results  Airway Mallampati: II  TM Distance: >3 FB     Dental   Pulmonary Current Smoker,    breath sounds clear to auscultation       Cardiovascular negative cardio ROS   Rhythm:Regular Rate:Normal     Neuro/Psych    GI/Hepatic negative GI ROS, Neg liver ROS,   Endo/Other  negative endocrine ROS  Renal/GU negative Renal ROS     Musculoskeletal   Abdominal   Peds  Hematology   Anesthesia Other Findings   Reproductive/Obstetrics                            Anesthesia Physical Anesthesia Plan  ASA: III  Anesthesia Plan: General   Post-op Pain Management:    Induction: Intravenous  PONV Risk Score and Plan: 2 and Ondansetron, Dexamethasone and Midazolam  Airway Management Planned: Oral ETT  Additional Equipment:   Intra-op Plan:   Post-operative Plan: Possible Post-op intubation/ventilation  Informed Consent: I have reviewed the patients History and Physical, chart, labs and discussed the procedure including the risks, benefits and alternatives for the proposed anesthesia with the patient or authorized representative who has indicated his/her understanding and acceptance.     Dental advisory given  Plan Discussed with: CRNA and Anesthesiologist  Anesthesia Plan Comments:         Anesthesia Quick Evaluation

## 2020-04-21 ENCOUNTER — Encounter (HOSPITAL_COMMUNITY)
Admission: RE | Disposition: A | Discharge status: Home / Self Care | Payer: Self-pay | Source: Home / Self Care | Attending: Gynecologic Oncology

## 2020-04-21 ENCOUNTER — Ambulatory Visit (HOSPITAL_COMMUNITY): Payer: BC Managed Care – PPO | Admitting: Physician Assistant

## 2020-04-21 ENCOUNTER — Encounter (HOSPITAL_COMMUNITY): Payer: Self-pay | Admitting: Gynecologic Oncology

## 2020-04-21 ENCOUNTER — Ambulatory Visit (HOSPITAL_COMMUNITY)
Admission: RE | Admit: 2020-04-21 | Discharge: 2020-04-21 | Disposition: A | Discharge status: Home / Self Care | Payer: BC Managed Care – PPO | Attending: Gynecologic Oncology | Admitting: Gynecologic Oncology

## 2020-04-21 DIAGNOSIS — Z79899 Other long term (current) drug therapy: Secondary | ICD-10-CM | POA: Insufficient documentation

## 2020-04-21 DIAGNOSIS — Z20822 Contact with and (suspected) exposure to covid-19: Secondary | ICD-10-CM | POA: Insufficient documentation

## 2020-04-21 DIAGNOSIS — R7303 Prediabetes: Secondary | ICD-10-CM | POA: Insufficient documentation

## 2020-04-21 DIAGNOSIS — N838 Other noninflammatory disorders of ovary, fallopian tube and broad ligament: Secondary | ICD-10-CM | POA: Diagnosis present

## 2020-04-21 DIAGNOSIS — N83201 Unspecified ovarian cyst, right side: Secondary | ICD-10-CM | POA: Diagnosis not present

## 2020-04-21 DIAGNOSIS — N8301 Follicular cyst of right ovary: Secondary | ICD-10-CM | POA: Insufficient documentation

## 2020-04-21 DIAGNOSIS — Z8262 Family history of osteoporosis: Secondary | ICD-10-CM | POA: Insufficient documentation

## 2020-04-21 DIAGNOSIS — Z8261 Family history of arthritis: Secondary | ICD-10-CM | POA: Diagnosis not present

## 2020-04-21 DIAGNOSIS — Z8249 Family history of ischemic heart disease and other diseases of the circulatory system: Secondary | ICD-10-CM | POA: Insufficient documentation

## 2020-04-21 DIAGNOSIS — N800 Endometriosis of uterus: Secondary | ICD-10-CM | POA: Diagnosis not present

## 2020-04-21 DIAGNOSIS — N939 Abnormal uterine and vaginal bleeding, unspecified: Secondary | ICD-10-CM | POA: Insufficient documentation

## 2020-04-21 DIAGNOSIS — Z791 Long term (current) use of non-steroidal anti-inflammatories (NSAID): Secondary | ICD-10-CM | POA: Diagnosis not present

## 2020-04-21 DIAGNOSIS — E669 Obesity, unspecified: Secondary | ICD-10-CM | POA: Diagnosis present

## 2020-04-21 DIAGNOSIS — N83202 Unspecified ovarian cyst, left side: Secondary | ICD-10-CM | POA: Diagnosis not present

## 2020-04-21 DIAGNOSIS — F17210 Nicotine dependence, cigarettes, uncomplicated: Secondary | ICD-10-CM | POA: Diagnosis not present

## 2020-04-21 DIAGNOSIS — D271 Benign neoplasm of left ovary: Secondary | ICD-10-CM | POA: Diagnosis not present

## 2020-04-21 DIAGNOSIS — D261 Other benign neoplasm of corpus uteri: Secondary | ICD-10-CM | POA: Diagnosis not present

## 2020-04-21 DIAGNOSIS — E7800 Pure hypercholesterolemia, unspecified: Secondary | ICD-10-CM | POA: Diagnosis not present

## 2020-04-21 HISTORY — PX: ROBOTIC ASSISTED TOTAL HYSTERECTOMY WITH BILATERAL SALPINGO OOPHERECTOMY: SHX6086

## 2020-04-21 LAB — TYPE AND SCREEN
ABO/RH(D): A POS
Antibody Screen: NEGATIVE

## 2020-04-21 LAB — PREGNANCY, URINE: Preg Test, Ur: NEGATIVE

## 2020-04-21 SURGERY — HYSTERECTOMY, TOTAL, ROBOT-ASSISTED, LAPAROSCOPIC, WITH BILATERAL SALPINGO-OOPHORECTOMY
Anesthesia: General | Site: Abdomen

## 2020-04-21 MED ORDER — CHLORHEXIDINE GLUCONATE 0.12 % MT SOLN
15.0000 mL | Freq: Once | OROMUCOSAL | Status: AC
  Administered 2020-04-21: 15 mL via OROMUCOSAL

## 2020-04-21 MED ORDER — DEXAMETHASONE SODIUM PHOSPHATE 10 MG/ML IJ SOLN
INTRAMUSCULAR | Status: AC
  Filled 2020-04-21: qty 1

## 2020-04-21 MED ORDER — STERILE WATER FOR IRRIGATION IR SOLN
Status: DC | PRN
  Administered 2020-04-21: 1000 mL

## 2020-04-21 MED ORDER — FENTANYL CITRATE (PF) 250 MCG/5ML IJ SOLN
INTRAMUSCULAR | Status: AC
  Filled 2020-04-21: qty 5

## 2020-04-21 MED ORDER — BUPIVACAINE HCL 0.25 % IJ SOLN
INTRAMUSCULAR | Status: AC
  Filled 2020-04-21: qty 1

## 2020-04-21 MED ORDER — DEXAMETHASONE SODIUM PHOSPHATE 4 MG/ML IJ SOLN: 4.0000 mg | INTRAMUSCULAR | Status: DC

## 2020-04-21 MED ORDER — ONDANSETRON HCL 4 MG/2ML IJ SOLN
INTRAMUSCULAR | Status: DC | PRN
  Administered 2020-04-21: 4 mg via INTRAVENOUS

## 2020-04-21 MED ORDER — CELECOXIB 200 MG PO CAPS
400.0000 mg | ORAL_CAPSULE | ORAL | Status: AC
  Administered 2020-04-21: 400 mg via ORAL
  Filled 2020-04-21: qty 2

## 2020-04-21 MED ORDER — ENOXAPARIN SODIUM 40 MG/0.4ML ~~LOC~~ SOLN
40.0000 mg | SUBCUTANEOUS | Status: AC
  Administered 2020-04-21: 40 mg via SUBCUTANEOUS
  Filled 2020-04-21: qty 0.4

## 2020-04-21 MED ORDER — CEFAZOLIN SODIUM-DEXTROSE 2-4 GM/100ML-% IV SOLN
2.0000 g | INTRAVENOUS | Status: AC
  Administered 2020-04-21: 2 g via INTRAVENOUS
  Filled 2020-04-21: qty 100

## 2020-04-21 MED ORDER — PROMETHAZINE HCL 25 MG/ML IJ SOLN: 6.2500 mg | Freq: Once | INTRAMUSCULAR | Status: AC

## 2020-04-21 MED ORDER — EPHEDRINE SULFATE-NACL 50-0.9 MG/10ML-% IV SOSY
PREFILLED_SYRINGE | INTRAVENOUS | Status: DC | PRN
  Administered 2020-04-21: 10 mg via INTRAVENOUS

## 2020-04-21 MED ORDER — HYDROMORPHONE HCL 1 MG/ML IJ SOLN
INTRAMUSCULAR | Status: AC
  Filled 2020-04-21: qty 1

## 2020-04-21 MED ORDER — PROPOFOL 10 MG/ML IV BOLUS
INTRAVENOUS | Status: AC
  Filled 2020-04-21: qty 40

## 2020-04-21 MED ORDER — FENTANYL CITRATE (PF) 100 MCG/2ML IJ SOLN
INTRAMUSCULAR | Status: DC | PRN
  Administered 2020-04-21 (×3): 50 ug via INTRAVENOUS

## 2020-04-21 MED ORDER — KETOROLAC TROMETHAMINE 15 MG/ML IJ SOLN
INTRAMUSCULAR | Status: AC
  Filled 2020-04-21: qty 1

## 2020-04-21 MED ORDER — KETOROLAC TROMETHAMINE 15 MG/ML IJ SOLN
15.0000 mg | Freq: Once | INTRAMUSCULAR | Status: AC | PRN
  Administered 2020-04-21: 15 mg via INTRAVENOUS

## 2020-04-21 MED ORDER — SUGAMMADEX SODIUM 200 MG/2ML IV SOLN
INTRAVENOUS | Status: DC | PRN
  Administered 2020-04-21: 200 mg via INTRAVENOUS

## 2020-04-21 MED ORDER — BUPIVACAINE HCL 0.25 % IJ SOLN
INTRAMUSCULAR | Status: DC | PRN
  Administered 2020-04-21: 19 mL

## 2020-04-21 MED ORDER — LIDOCAINE 2% (20 MG/ML) 5 ML SYRINGE
INTRAMUSCULAR | Status: DC | PRN
  Administered 2020-04-21: 50 mg via INTRAVENOUS

## 2020-04-21 MED ORDER — LACTATED RINGERS IV SOLN: INTRAVENOUS | Status: DC

## 2020-04-21 MED ORDER — STERILE WATER FOR INJECTION IJ SOLN
INTRAMUSCULAR | Status: AC
  Filled 2020-04-21: qty 20

## 2020-04-21 MED ORDER — SCOPOLAMINE 1 MG/3DAYS TD PT72
1.0000 | MEDICATED_PATCH | TRANSDERMAL | Status: DC
  Administered 2020-04-21: 1.5 mg via TRANSDERMAL
  Filled 2020-04-21: qty 1

## 2020-04-21 MED ORDER — DEXAMETHASONE SODIUM PHOSPHATE 10 MG/ML IJ SOLN
INTRAMUSCULAR | Status: DC | PRN
  Administered 2020-04-21: 10 mg via INTRAVENOUS

## 2020-04-21 MED ORDER — ONDANSETRON HCL 4 MG/2ML IJ SOLN
INTRAMUSCULAR | Status: AC
  Filled 2020-04-21: qty 2

## 2020-04-21 MED ORDER — FENTANYL CITRATE (PF) 100 MCG/2ML IJ SOLN
25.0000 ug | INTRAMUSCULAR | Status: DC | PRN
Start: 2020-04-21 — End: 2020-04-21

## 2020-04-21 MED ORDER — PROMETHAZINE HCL 25 MG/ML IJ SOLN
INTRAMUSCULAR | Status: AC
  Administered 2020-04-21: 6.25 mg via INTRAVENOUS
  Filled 2020-04-21: qty 1

## 2020-04-21 MED ORDER — MIDAZOLAM HCL 5 MG/5ML IJ SOLN
INTRAMUSCULAR | Status: DC | PRN
  Administered 2020-04-21: 2 mg via INTRAVENOUS

## 2020-04-21 MED ORDER — ORAL CARE MOUTH RINSE: 15.0000 mL | Freq: Once | OROMUCOSAL | Status: AC

## 2020-04-21 MED ORDER — SODIUM CHLORIDE 0.9% FLUSH: 3.0000 mL | Freq: Two times a day (BID) | INTRAVENOUS | Status: DC

## 2020-04-21 MED ORDER — MIDAZOLAM HCL 2 MG/2ML IJ SOLN
INTRAMUSCULAR | Status: AC
  Filled 2020-04-21: qty 2

## 2020-04-21 MED ORDER — ROCURONIUM BROMIDE 10 MG/ML (PF) SYRINGE
PREFILLED_SYRINGE | INTRAVENOUS | Status: AC
  Filled 2020-04-21: qty 10

## 2020-04-21 MED ORDER — LACTATED RINGERS IR SOLN
Status: DC | PRN
  Administered 2020-04-21: 1000 mL

## 2020-04-21 MED ORDER — ROCURONIUM BROMIDE 10 MG/ML (PF) SYRINGE
PREFILLED_SYRINGE | INTRAVENOUS | Status: DC | PRN
  Administered 2020-04-21: 10 mg via INTRAVENOUS
  Administered 2020-04-21: 20 mg via INTRAVENOUS
  Administered 2020-04-21: 50 mg via INTRAVENOUS

## 2020-04-21 MED ORDER — GABAPENTIN 300 MG PO CAPS
300.0000 mg | ORAL_CAPSULE | ORAL | Status: AC
  Administered 2020-04-21: 300 mg via ORAL
  Filled 2020-04-21: qty 1

## 2020-04-21 MED ORDER — ACETAMINOPHEN 500 MG PO TABS
1000.0000 mg | ORAL_TABLET | ORAL | Status: AC
  Administered 2020-04-21: 1000 mg via ORAL
  Filled 2020-04-21: qty 2

## 2020-04-21 MED ORDER — PROPOFOL 10 MG/ML IV BOLUS
INTRAVENOUS | Status: DC | PRN
  Administered 2020-04-21: 150 mg via INTRAVENOUS

## 2020-04-21 MED ORDER — HYDROMORPHONE HCL 1 MG/ML IJ SOLN: 0.2500 mg | INTRAMUSCULAR | Status: DC | PRN

## 2020-04-21 MED ORDER — LIDOCAINE HCL (PF) 2 % IJ SOLN
INTRAMUSCULAR | Status: AC
  Filled 2020-04-21: qty 5

## 2020-04-21 SURGICAL SUPPLY — 66 items
APPLICATOR SURGIFLO ENDO (HEMOSTASIS) IMPLANT
BACTOSHIELD CHG 4% 4OZ (MISCELLANEOUS) ×1
BAG LAPAROSCOPIC 12 15 PORT 16 (BASKET) IMPLANT
BAG RETRIEVAL 12/15 (BASKET)
BLADE SURG SZ10 CARB STEEL (BLADE) IMPLANT
COVER BACK TABLE 60X90IN (DRAPES) ×3 IMPLANT
COVER TIP SHEARS 8 DVNC (MISCELLANEOUS) ×2 IMPLANT
COVER TIP SHEARS 8MM DA VINCI (MISCELLANEOUS) ×1
COVER WAND RF STERILE (DRAPES) IMPLANT
DECANTER SPIKE VIAL GLASS SM (MISCELLANEOUS) IMPLANT
DERMABOND ADVANCED (GAUZE/BANDAGES/DRESSINGS) ×1
DERMABOND ADVANCED .7 DNX12 (GAUZE/BANDAGES/DRESSINGS) ×2 IMPLANT
DRAPE ARM DVNC X/XI (DISPOSABLE) ×8 IMPLANT
DRAPE COLUMN DVNC XI (DISPOSABLE) ×2 IMPLANT
DRAPE DA VINCI XI ARM (DISPOSABLE) ×4
DRAPE DA VINCI XI COLUMN (DISPOSABLE) ×1
DRAPE SHEET LG 3/4 BI-LAMINATE (DRAPES) ×3 IMPLANT
DRAPE SURG IRRIG POUCH 19X23 (DRAPES) ×3 IMPLANT
DRSG OPSITE POSTOP 4X6 (GAUZE/BANDAGES/DRESSINGS) IMPLANT
DRSG OPSITE POSTOP 4X8 (GAUZE/BANDAGES/DRESSINGS) IMPLANT
ELECT PENCIL ROCKER SW 15FT (MISCELLANEOUS) IMPLANT
ELECT REM PT RETURN 15FT ADLT (MISCELLANEOUS) ×3 IMPLANT
GLOVE SURG ENC MOIS LTX SZ6 (GLOVE) ×12 IMPLANT
GLOVE SURG ENC MOIS LTX SZ6.5 (GLOVE) ×6 IMPLANT
GOWN STRL REUS W/ TWL LRG LVL3 (GOWN DISPOSABLE) ×8 IMPLANT
GOWN STRL REUS W/TWL LRG LVL3 (GOWN DISPOSABLE) ×4
HOLDER FOLEY CATH W/STRAP (MISCELLANEOUS) IMPLANT
IRRIG SUCT STRYKERFLOW 2 WTIP (MISCELLANEOUS) ×3
IRRIGATION SUCT STRKRFLW 2 WTP (MISCELLANEOUS) ×2 IMPLANT
KIT PROCEDURE DA VINCI SI (MISCELLANEOUS)
KIT PROCEDURE DVNC SI (MISCELLANEOUS) IMPLANT
KIT TURNOVER KIT A (KITS) IMPLANT
MANIPULATOR UTERINE 4.5 ZUMI (MISCELLANEOUS) ×3 IMPLANT
NEEDLE HYPO 22GX1.5 SAFETY (NEEDLE) ×3 IMPLANT
NEEDLE SPNL 18GX3.5 QUINCKE PK (NEEDLE) IMPLANT
OBTURATOR OPTICAL STANDARD 8MM (TROCAR) ×1
OBTURATOR OPTICAL STND 8 DVNC (TROCAR) ×2
OBTURATOR OPTICALSTD 8 DVNC (TROCAR) ×2 IMPLANT
PACK ROBOT GYN CUSTOM WL (TRAY / TRAY PROCEDURE) ×3 IMPLANT
PAD POSITIONING PINK XL (MISCELLANEOUS) ×3 IMPLANT
PORT ACCESS TROCAR AIRSEAL 12 (TROCAR) ×2 IMPLANT
PORT ACCESS TROCAR AIRSEAL 5M (TROCAR) ×1
POUCH SPECIMEN RETRIEVAL 10MM (ENDOMECHANICALS) IMPLANT
SCISSORS LAP 5X45 EPIX DISP (ENDOMECHANICALS) ×3 IMPLANT
SCRUB CHG 4% DYNA-HEX 4OZ (MISCELLANEOUS) ×2 IMPLANT
SEAL CANN UNIV 5-8 DVNC XI (MISCELLANEOUS) ×6 IMPLANT
SEAL XI 5MM-8MM UNIVERSAL (MISCELLANEOUS) ×3
SET TRI-LUMEN FLTR TB AIRSEAL (TUBING) ×3 IMPLANT
SPONGE LAP 18X18 RF (DISPOSABLE) IMPLANT
SURGIFLO W/THROMBIN 8M KIT (HEMOSTASIS) IMPLANT
SUT MNCRL AB 4-0 PS2 18 (SUTURE) IMPLANT
SUT PDS AB 1 TP1 96 (SUTURE) IMPLANT
SUT VIC AB 0 CT1 27 (SUTURE)
SUT VIC AB 0 CT1 27XBRD ANTBC (SUTURE) IMPLANT
SUT VIC AB 2-0 CT1 27 (SUTURE)
SUT VIC AB 2-0 CT1 TAPERPNT 27 (SUTURE) IMPLANT
SUT VIC AB 4-0 PS2 18 (SUTURE) ×6 IMPLANT
SYR 10ML LL (SYRINGE) IMPLANT
SYR 20ML LL LF (SYRINGE) IMPLANT
SYR 50ML LL SCALE MARK (SYRINGE) IMPLANT
TOWEL OR NON WOVEN STRL DISP B (DISPOSABLE) ×3 IMPLANT
TRAP SPECIMEN MUCUS 40CC (MISCELLANEOUS) IMPLANT
TRAY FOLEY MTR SLVR 16FR STAT (SET/KITS/TRAYS/PACK) ×3 IMPLANT
TROCAR XCEL NON-BLD 5MMX100MML (ENDOMECHANICALS) IMPLANT
UNDERPAD 30X36 HEAVY ABSORB (UNDERPADS AND DIAPERS) ×3 IMPLANT
WATER STERILE IRR 1000ML POUR (IV SOLUTION) ×3 IMPLANT

## 2020-04-21 NOTE — Transfer of Care (Signed)
Immediate Anesthesia Transfer of Care Note  Patient: Diamond Austin  Procedure(s) Performed: XI ROBOTIC ASSISTED TOTAL HYSTERECTOMY WITH LEFT SALPINGO OOPHORECTOMY. RIGHT SALPINGECTOMY, RIGHT OVARIAN CYSTECTOMY (N/A Abdomen)  Patient Location: PACU  Anesthesia Type:General  Level of Consciousness: awake, alert  and drowsy  Airway & Oxygen Therapy: Patient connected to face mask oxygen  Post-op Assessment: Report given to RN, Post -op Vital signs reviewed and stable and Patient moving all extremities  Post vital signs: Reviewed and stable  Last Vitals:  Vitals Value Taken Time  BP 116/69 04/21/20 0926  Temp    Pulse 91 04/21/20 0929  Resp 20 04/21/20 0929  SpO2 100 % 04/21/20 0929  Vitals shown include unvalidated device data.  Last Pain:  Vitals:   04/21/20 0547  PainSc: 0-No pain         Complications: No complications documented.

## 2020-04-21 NOTE — Op Note (Signed)
OPERATIVE NOTE 04/21/20  Surgeon: Donaciano Eva   Assistants: Dr Lahoma Crocker (an MD assistant was necessary for tissue manipulation, management of robotic instrumentation, retraction and positioning due to the complexity of the case and hospital policies).   Anesthesia: General endotracheal anesthesia  ASA Class: 3   Pre-operative Diagnosis: left ovarian mass, elevated ROMA score, abnormal uterine bleeding  Post-operative Diagnosis: same and left dermoid cyst of the ovary, benign endometrium  Operation: Robotic-assisted laparoscopic total hysterectomy with left salpingoophorectomy, right salpingectomy, right ovarian cystectomy  Surgeon: Donaciano Eva  Assistant Surgeon: Lahoma Crocker MD  Anesthesia: GET  Urine Output: 150cc  Operative Findings:  : normal appearing 8cm uterus. Surgically interrupted fallopian tubes. Left ovary replaced by a 8cm ovarian cyst (dermoid on frozen section with small focus of atypia in an epithelial component "not enough to call it malignant"), 2cm right ovarian cyst. Normal appendix, upper abdomen, omentum, diaphragms. Small adhesion of omentum to undersurface of umbilicus with pinpoint facial defect at the umbilicus (not large enough to close as hernia repair).   Estimated Blood Loss:  20cc      Total IV Fluids: 800 ml         Specimens: uterus with cervix and bilateral tubes, left ovary. Right ovarian cyst. Washings.          Complications:  None; patient tolerated the procedure well.         Disposition: PACU - hemodynamically stable.  Procedure Details  The patient was seen in the Holding Room. The risks, benefits, complications, treatment options, and expected outcomes were discussed with the patient.  The patient concurred with the proposed plan, giving informed consent.  The site of surgery properly noted/marked. The patient was identified as Diamond Austin and the procedure verified as a Robotic-assisted hysterectomy  with unilateral salpingo oophorectomy. A Time Out was held and the above information confirmed.  After induction of anesthesia, the patient was draped and prepped in the usual sterile manner. Pt was placed in supine position after anesthesia and draped and prepped in the usual sterile manner. The abdominal drape was placed after the CholoraPrep had been allowed to dry for 3 minutes.  Her arms were tucked to her side with all appropriate precautions.  The shoulders were stabilized with padded shoulder blocks applied to the acromium processes.  The patient was placed in the semi-lithotomy position in [place].  The perineum was prepped with Betadine. The patient was then prepped. Foley catheter was placed.  A sterile speculum was placed in the vagina.  The cervix was grasped with a single-tooth tenaculum and dilated with Kennon Rounds dilators.  The ZUMI uterine manipulator with a medium colpotomizer ring was placed without difficulty.  A pneum occluder balloon was placed over the manipulator.  OG tube placement was confirmed and to suction.   Next, a 5 mm skin incision was made 1 cm below the subcostal margin in the midclavicular line.  The 5 mm Optiview port and scope was used for direct entry.  Opening pressure was under 10 mm CO2.  The abdomen was insufflated and the findings were noted as above.   At this point and all points during the procedure, the patient's intra-abdominal pressure did not exceed 15 mmHg. Next, a 10 mm skin incision was made 5cm above the umbilicus in a site of prior incision and a right and left port was placed about 10 cm lateral to the robot port on the right and left side.  The omental adhesions were  taken down sharply from the umbilicus revealing a pinpoint fascial defect at the umbilicus (inferior to the level of the midline port). All ports were placed under direct visualization.  The patient was placed in steep Trendelenburg.  Bowel was folded away into the upper abdomen.  The robot  was docked in the normal manner.  The hysterectomy was started after the round ligament on the right side was incised and the retroperitoneum was entered and the pararectal space was developed.  The ureter was noted to be on the medial leaf of the broad ligament.  The peritoneum above the ureter was incised and stretched and the utero-ovarian ligament was skeletonized, cauterized and cut.  The ovary was skeletonized from the fallopian tube which was kept attached to the uterus. The posterior peritoneum was taken down to the level of the KOH ring.  The anterior peritoneum was also taken down.  The bladder flap was created to the level of the KOH ring.  The uterine artery on the right side was skeletonized, cauterized and cut in the normal manner.  A similar procedure was performed on the left however on the left there was a large left ovarian mass. The left ovary was removed by transecting the skeletonized left ovarian vessels.  The colpotomy was made and the uterus, cervix, left tube and ovary were amputated and delivered through the vagina.  Pedicles were inspected and excellent hemostasis was achieved.    The right ovary was carefully inspected and a 2cm right ovarian cyst. An incision was made overlying the cyst with the scissors and the right ovarian cyst was carefully dissected from the underlying stroma. The base of the cyst was made hemostatic with bipolar energy. The cyst was sent for permanent pathology.  The colpotomy at the vaginal cuff was closed with Vicryl on a CT1 needle in a running manner.  Irrigation was used and excellent hemostasis was achieved.  At this point in the procedure was completed.  Robotic instruments were removed under direct visulaization.  The robot was undocked. The 10 mm ports were closed with Vicryl on a UR-5 needle and the fascia was closed with 0 Vicryl on a UR-5 needle.  The skin was closed with 4-0 Vicryl in a subcuticular manner.  Dermabond was applied.  Sponge, lap  and needle counts correct x 2.  The patient was taken to the recovery room in stable condition.  The vagina was swabbed with  minimal bleeding noted.   All instrument and needle counts were correct x  3.   The patient was transferred to the recovery room in a stable condition.  Donaciano Eva, MD

## 2020-04-21 NOTE — H&P (Signed)
Follow-up Note: Gyn-Onc  Consult was requested by Dr. Landry Mellow for the evaluation of Diamond Austin 40 y.o. female  CC:  Ovarian mass, abnormal uterine bleeding.   Assessment/Plan:  [name]  is a 40 y.o.  year old with a large ovarian cystic mass most consistent with a dermoid on imaging, in the setting of very mildly elevated premenopausal ROMA score.  She has abnormal uterine bleeding and nondiagnostic sampling in the office.  I am recommending robotic assisted total hysterectomy with unilateral salpingo-oophorectomy, possible BSO and staging.  I explained that overall I have a low suspicion for malignancy, however there is no way to know for certain prior to surgical removal of the ovary.  Given the slightly complex features of the cyst and the abnormal tumor marker profile, this is reasonable.  Additionally due to the nondiagnostic work-up of her endometrium in the office in the setting of abnormal uterine bleeding, she requires endometrial assessment.  She is completed childbearing and is excepting of permanent infertility that hysterectomy would create.  I explained that there would be benefit in retaining her contralateral ovary should pathology from surgery be benign given her young premenopausal age.  Patient strongly desires waiting until the new year to proceed with surgical scheduling.  I think that this is safe given the reassuring imaging and tumor marker profiles overall.  She was however offered a date sooner, in November, but declined this.  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  HPI: Ms Diamond Austin is a 40 year old P3 who was seen in consultation at the request of Dr Landry Mellow for evaluation of a left ovarian cystic mass and abnormal uterine bleeding.  The patient reported having a change in her normal menstrual cycle since July 2021.  She sought evaluation from her primary care doctor in September 2021 and an  ultrasound scan was performed to work this up.  This was performed on 12/31/2019 and revealed a uterus measuring 10.9 x 4.6 x 6.1 cm with a 9 mm endometrial thickness.  The right ovary measured 3.4 x 1.8 x 1.3 cm and was normal.  The left ovary measured 9.1 x 6 x 6.1 cm and contains a large echo genic complex mass measuring 6.7 x 5.4 x 7 cm.  The findings were potential for a dermoid.  She was sent to Dr. Landry Mellow for further evaluation.  Endometrial biopsy was performed by Dr. Landry Mellow on January 15, 2020 and this revealed menstrual type endometrium with separate minute fragments of benign endocervical glands and mucus.  No hyperplasia or carcinoma or endometritis.  Ca1 25 and HD4 were drawn that same day on January 15, 2020 as part of a Roma score.  This resulted with a normal Ca1 25 at 10.1, normal HD4 at 61.9, but a very slightly elevated premenopausal Roma score at 1.15 (upper limit of normal being 1.14). Her medical history is most significant for obesity, hypothyroidism, hypercholesterolemia, tobacco use (6 to 8 cigarettes/day).  Her surgical history is most significant for a tubal ligation for permanent sterilization.  Her gynecologic history is remarkable for 3 prior vaginal deliveries.  No prior history of abnormal Pap tests or HPV.  Her family cancer history is unremarkable.  She works as Web designer. She lives with her 3 children.  She travels to the office 2-3 times per week.   Current Meds:  Outpatient Encounter Medications as of 01/25/2020  Medication Sig  . calcium-vitamin D (  OSCAL WITH D) 250-125 MG-UNIT per tablet Take 1 tablet by mouth daily.  Marland Kitchen ibuprofen (ADVIL,MOTRIN) 600 MG tablet Take 1 tablet (600 mg total) by mouth every 6 (six) hours.  . naproxen (NAPROSYN) 500 MG tablet Take 500 mg by mouth 2 (two) times daily.  . psyllium (METAMUCIL) 58.6 % packet Take 1 packet by mouth daily.  . Vitamin D, Ergocalciferol, (DRISDOL) 1.25 MG (50000 UNIT) CAPS capsule Take 1  capsule by mouth once a week.  . [DISCONTINUED] oxyCODONE-acetaminophen (ROXICET) 5-325 MG tablet Take 1 tablet by mouth every 6 (six) hours as needed for severe pain.  . [DISCONTINUED] Prenatal Vit-Fe Fumarate-FA (PRENATAL MULTIVITAMIN) TABS Take 1 tablet by mouth daily.   No facility-administered encounter medications on file as of 01/25/2020.    Allergy: No Known Allergies  Social Hx:   Social History   Socioeconomic History  . Marital status: Married    Spouse name: Not on file  . Number of children: Not on file  . Years of education: Not on file  . Highest education level: Not on file  Occupational History  . Not on file  Tobacco Use  . Smoking status: Current Every Day Smoker    Packs/day: 0.25    Years: 8.00    Pack years: 2.00    Types: Cigarettes  . Smokeless tobacco: Never Used  Vaping Use  . Vaping Use: Never used  Substance and Sexual Activity  . Alcohol use: No  . Drug use: No  . Sexual activity: Yes    Birth control/protection: None  Other Topics Concern  . Not on file  Social History Narrative  . Not on file   Social Determinants of Health   Financial Resource Strain: Not on file  Food Insecurity: Not on file  Transportation Needs: Not on file  Physical Activity: Not on file  Stress: Not on file  Social Connections: Not on file  Intimate Partner Violence: Not on file    Past Surgical Hx:  Past Surgical History:  Procedure Laterality Date  . TUBAL LIGATION Bilateral 04/14/2015   Procedure: POST PARTUM TUBAL LIGATION;  Surgeon: Lavonia Drafts, MD;  Location: [facility];  Service: Gynecology;  Laterality: Bilateral;  . VAGINAL DELIVERY     X 1    Past Medical Hx:  Past Medical History:  Diagnosis Date  . Elevated liver function tests    2011  . Hypercholesteremia   . Pre-diabetes 2019    Past Gynecological History: See HPI Patient's last menstrual period was 03/24/2020.  Family Hx:  Family History  Problem Relation Age of Onset   . Osteoporosis Mother   . Hypertension Father   . Hypertension Brother   . Arthritis Paternal Grandmother     Review of Systems:  Constitutional  Feels well,    ENT Normal appearing ears and nares bilaterally Skin/Breast  No rash, sores, jaundice, itching, dryness Cardiovascular  No chest pain, shortness of breath, or edema  Pulmonary  No cough or wheeze.  Gastro Intestinal  No nausea, vomitting, or diarrhoea. No bright red blood per rectum, no abdominal pain, change in bowel movement, or constipation.  Genito Urinary  No frequency, urgency, dysuria, increased frequency of menses since July 2021 Musculo Skeletal  No myalgia, arthralgia, joint swelling or pain  Neurologic  No weakness, numbness, change in gait,  Psychology  No depression, anxiety, insomnia.   Vitals:  Blood pressure 128/85, pulse 76, temperature 97.9 F (36.6 C), resp. rate 16, weight 194 lb (88 kg), last menstrual  period 03/24/2020, SpO2 97 %.  Physical Exam: WD in NAD Neck  Supple NROM, without any enlargements.  Lymph Node Survey No cervical supraclavicular or inguinal adenopathy Cardiovascular  Pulse normal rate, regularity and rhythm. S1 and S2 normal.  Lungs  Clear to auscultation bilateraly, without wheezes/crackles/rhonchi. Good air movement.  Skin  No rash/lesions/breakdown  Psychiatry  Alert and oriented to person, place, and time  Abdomen  Normoactive bowel sounds, abdomen soft, non-tender and obese without evidence of hernia.  Back No CVA tenderness Genito Urinary  Vulva/vagina: Normal external female genitalia.   No lesions. No discharge or bleeding.  Bladder/urethra:  No lesions or masses, well supported bladder  Vagina: Grossly normal  Cervix: Normal appearing, no lesions.  Uterus:  Small, mobile, no parametrial involvement or nodularity.  Adnexa: Smooth round full left pelvic adnexal mass which was mobile. Rectal  Good tone, no masses no cul de sac nodularity.  Extremities   No bilateral cyanosis, clubbing or edema.   Thereasa Solo, MD  04/21/2020, 7:16 AM

## 2020-04-21 NOTE — Discharge Instructions (Signed)
04/21/2020  Return to work: 4 weeks  Activity: 1. Be up and out of the bed during the day.  Take a nap if needed.  You may walk up steps but be careful and use the hand rail.  Stair climbing will tire you more than you think, you may need to stop part way and rest.   2. No lifting or straining for 6 weeks.  3. No driving for 1 week.  Do Not drive if you are taking narcotic pain medicine.  4. Shower daily.  Use soap and water on your incision and pat dry; don't rub.   5. No sexual activity and nothing in the vagina for 8 weeks.  Medications:  - Take ibuprofen and tylenol first line for pain control. Take these regularly (every 6 hours) to decrease the build up of pain.  - If necessary, for severe pain not relieved by ibuprofen, take oxycodone.  - While taking oxycodone you should take sennakot every night to reduce the likelihood of constipation. If this causes diarrhea, stop its use.  Diet: 1. Low sodium Heart Healthy Diet is recommended.  2. It is safe to use a laxative if you have difficulty moving your bowels.   Wound Care: 1. Keep clean and dry.  Shower daily.  Reasons to call the Doctor:   Fever - Oral temperature greater than 100.4 degrees Fahrenheit  Foul-smelling vaginal discharge  Difficulty urinating  Nausea and vomiting  Increased pain at the site of the incision that is unrelieved with pain medicine.  Difficulty breathing with or without chest pain  New calf pain especially if only on one side  Sudden, continuing increased vaginal bleeding with or without clots.   Follow-up: 1. See Everitt Amber in 3 weeks.  Contacts: For questions or concerns you should contact:  Dr. Everitt Amber at [phone] After hours and on week-ends call [phone] and ask to speak to the physician on call for Gynecologic Oncology

## 2020-04-21 NOTE — Anesthesia Procedure Notes (Signed)
Procedure Name: Intubation Date/Time: 04/21/2020 7:56 AM Performed by: Lavina Hamman, CRNA Pre-anesthesia Checklist: Patient identified, Emergency Drugs available, Suction available and Patient being monitored Patient Re-evaluated:Patient Re-evaluated prior to induction Oxygen Delivery Method: Circle System Utilized Preoxygenation: Pre-oxygenation with 100% oxygen Induction Type: IV induction Ventilation: Mask ventilation without difficulty Laryngoscope Size: Mac and 3 Grade View: Grade I Tube type: Oral Tube size: 7.0 mm Number of attempts: 1 Airway Equipment and Method: Stylet and Oral airway Placement Confirmation: ETT inserted through vocal cords under direct vision,  positive ETCO2 and breath sounds checked- equal and bilateral Tube secured with: Tape Dental Injury: Teeth and Oropharynx as per pre-operative assessment

## 2020-04-21 NOTE — Addendum Note (Signed)
Addendum  created 04/21/20 1128 by Belinda Block, MD   Order list changed, Order sets accessed, Pharmacy for encounter modified

## 2020-04-21 NOTE — Anesthesia Postprocedure Evaluation (Signed)
Anesthesia Post Note  Patient: Diamond Austin  Procedure(s) Performed: XI ROBOTIC ASSISTED TOTAL HYSTERECTOMY WITH LEFT SALPINGO OOPHORECTOMY. RIGHT SALPINGECTOMY, RIGHT OVARIAN CYSTECTOMY (N/A Abdomen)     Patient location during evaluation: PACU Anesthesia Type: General Level of consciousness: awake Pain management: pain level controlled Vital Signs Assessment: post-procedure vital signs reviewed and stable Respiratory status: spontaneous breathing Cardiovascular status: stable Postop Assessment: no apparent nausea or vomiting Anesthetic complications: no   No complications documented.  Last Vitals:  Vitals:   04/21/20 0926 04/21/20 0945  BP: 116/69 120/81  Pulse: 88 60  Resp: 12 15  Temp: 36.4 C   SpO2: 99% 100%    Last Pain:  Vitals:   04/21/20 0949  PainSc: 4                  Shaaron Golliday

## 2020-04-22 ENCOUNTER — Encounter (HOSPITAL_COMMUNITY): Payer: Self-pay | Admitting: Gynecologic Oncology

## 2020-04-22 ENCOUNTER — Telehealth: Payer: Self-pay

## 2020-04-22 LAB — SURGICAL PATHOLOGY

## 2020-04-22 LAB — CYTOLOGY - NON PAP

## 2020-04-22 NOTE — Telephone Encounter (Signed)
Ms  Diamond Austin states that she is eating,drinking, and urinating well. Passing gas and has had a BM.  Using Senokot-S. Afebrile. Incisions D&I Pt aware of post op appointments as well as the office number [phone] and after hours number [phone] to call if she has any questions or concerns

## 2020-04-25 ENCOUNTER — Telehealth: Payer: Self-pay

## 2020-04-25 NOTE — Telephone Encounter (Signed)
Told Ms Lewis that the final pathology showed no pre cancer or cancer. The cyst was benign per Melissa Cross,NP. Pt verbalized understanding.

## 2020-04-26 ENCOUNTER — Encounter (HOSPITAL_COMMUNITY): Payer: Self-pay | Admitting: Emergency Medicine

## 2020-04-26 ENCOUNTER — Emergency Department (HOSPITAL_COMMUNITY)
Admission: EM | Admit: 2020-04-26 | Discharge: 2020-04-26 | Disposition: A | Discharge status: Home / Self Care | Payer: BC Managed Care – PPO | Attending: Emergency Medicine | Admitting: Emergency Medicine

## 2020-04-26 ENCOUNTER — Ambulatory Visit (HOSPITAL_COMMUNITY)
Admission: EM | Admit: 2020-04-26 | Discharge: 2020-04-26 | Discharge status: Against Medical Advice | Payer: BC Managed Care – PPO

## 2020-04-26 ENCOUNTER — Other Ambulatory Visit: Payer: Self-pay

## 2020-04-26 ENCOUNTER — Telehealth: Payer: Self-pay

## 2020-04-26 ENCOUNTER — Other Ambulatory Visit: Payer: Self-pay | Admitting: Gynecologic Oncology

## 2020-04-26 DIAGNOSIS — M79604 Pain in right leg: Secondary | ICD-10-CM | POA: Insufficient documentation

## 2020-04-26 DIAGNOSIS — F17210 Nicotine dependence, cigarettes, uncomplicated: Secondary | ICD-10-CM | POA: Insufficient documentation

## 2020-04-26 MED ORDER — ENOXAPARIN SODIUM 150 MG/ML ~~LOC~~ SOLN
130.0000 mg | Freq: Once | SUBCUTANEOUS | Status: AC
  Administered 2020-04-26: 130 mg via SUBCUTANEOUS
  Filled 2020-04-26 (×2): qty 0.86

## 2020-04-26 NOTE — Telephone Encounter (Signed)
Told Ms Toole that Dr. Denman George would like to get a doppler study to r/o DVT. The office is closed and will call her in the am for an appointment. Pt verbalized understanding.

## 2020-04-26 NOTE — Discharge Instructions (Signed)
1.  You have been given a dose of a blood thinner called Lovenox.  This is to treat for possible blood clot until you get your ultrasound done.  Your test should be done within the next 24 hours. 2.  Follow-up with your doctor for recheck. 3.  Return to the emergency department immediately if you develop chest pain, shortness of breath, lightheadedness or other concerning symptoms

## 2020-04-26 NOTE — Progress Notes (Signed)
See RN note about new onset right leg pain post-op

## 2020-04-26 NOTE — Telephone Encounter (Signed)
Diamond Austin.states that she noticed that when she has been ambulating in the last 3 hours she feels a pull bhing her right knee. The knee and leg are not swollen, warm to the touch, or any reddened areas. She is not SOB.    Reviewed with Dr. Denman George.  Dr. Denman George would like to get a doppler study of the right leg to r/o DVT.

## 2020-04-26 NOTE — ED Provider Notes (Signed)
[facility] EMERGENCY DEPARTMENT Provider Note   CSN: [id number] Arrival date & time: 04/26/20  3295     History Chief Complaint  Patient presents with  . Leg Pain    Diamond Austin is a 40 y.o. female.  HPI Patient ports that she had a hysterectomy 4 days ago.  She reports that is going is anticipated.  She is here because she started to get pain in her right calf.  She reports is achy and somewhat worse with standing.  No prior history of DVT.  No injury.  Patient is not experiencing any shortness of breath or chest pain.  She reports she was referred by her provider to come to the emergency department for further evaluation of possible clot in her leg.    Past Medical History:  Diagnosis Date  . Elevated liver function tests    2011  . Hypercholesteremia   . Pre-diabetes 2019    Patient Active Problem List   Diagnosis Date Noted  . Ovarian mass 01/25/2020  . Abnormal uterine bleeding 01/25/2020  . Obesity (BMI 30-39.9) 01/25/2020  . Encounter for sterilization 04/14/2015  . Active labor 04/12/2015  . Advanced maternal age in multigravida     Past Surgical History:  Procedure Laterality Date  . ROBOTIC ASSISTED TOTAL HYSTERECTOMY WITH BILATERAL SALPINGO OOPHERECTOMY N/A 04/21/2020   Procedure: XI ROBOTIC ASSISTED TOTAL HYSTERECTOMY WITH LEFT SALPINGO OOPHORECTOMY. RIGHT SALPINGECTOMY, RIGHT OVARIAN CYSTECTOMY;  Surgeon: Everitt Amber, MD;  Location: [facility];  Service: Gynecology;  Laterality: N/A;  . TUBAL LIGATION Bilateral 04/14/2015   Procedure: POST PARTUM TUBAL LIGATION;  Surgeon: Lavonia Drafts, MD;  Location: [facility];  Service: Gynecology;  Laterality: Bilateral;  . VAGINAL DELIVERY     X 1     OB History    Gravida  4   Para  3   Term  3   Preterm  0   AB  1   Living  3     SAB  0   IAB  1   Ectopic  0   Multiple  0   Live Births  3           Family History  Problem Relation Age of Onset  . Osteoporosis  Mother   . Hypertension Father   . Hypertension Brother   . Arthritis Paternal Grandmother     Social History   Tobacco Use  . Smoking status: Current Every Day Smoker    Packs/day: 0.25    Years: 8.00    Pack years: 2.00    Types: Cigarettes  . Smokeless tobacco: Never Used  Vaping Use  . Vaping Use: Never used  Substance Use Topics  . Alcohol use: No  . Drug use: No    Home Medications Prior to Admission medications   Medication Sig Start Date End Date Taking? Authorizing Provider  calcium-vitamin D (OSCAL WITH D) 250-125 MG-UNIT per tablet Take 1 tablet by mouth daily.    [provider]  ibuprofen (ADVIL) 800 MG tablet Take 1 tablet (800 mg total) by mouth every 8 (eight) hours as needed for moderate pain. For AFTER surgery only 04/15/20   Joylene John D, NP  Multiple Vitamins-Minerals (MULTIVITAMIN WITH MINERALS) tablet Take 1 tablet by mouth daily.    [provider]  naproxen (NAPROSYN) 500 MG tablet Take 500 mg by mouth 2 (two) times daily as needed for moderate pain. 01/06/20   [provider]  oxyCODONE (OXY IR/ROXICODONE) 5 MG  immediate release tablet Take 1 tablet (5 mg total) by mouth every 4 (four) hours as needed for severe pain. For AFTER surgery only, do not take and drive 06/15/38   Cross, Lenna Sciara D, NP  psyllium (METAMUCIL) 58.6 % packet Take 1 packet by mouth daily.    [provider]  senna-docusate (SENOKOT-S) 8.6-50 MG tablet Take 2 tablets by mouth at bedtime. For AFTER surgery, do not take if having diarrhea 04/15/20   Joylene John D, NP  Vitamin D, Ergocalciferol, (DRISDOL) 1.25 MG (50000 UNIT) CAPS capsule Take 1 capsule by mouth once a week. 01/05/20   [provider]    Allergies    Patient has no known allergies.  Review of Systems   Review of Systems 10 systems reviewed and negative except as per HPI Physical Exam Updated Vital Signs BP 139/82 (BP Location: Right Arm)   Pulse 78   Temp 98 F (36.7  C) (Oral)   Resp 16   LMP 03/24/2020   SpO2 99%   Physical Exam Constitutional:      Comments: Alert and nontoxic.  Clinically well in appearance  Eyes:     Extraocular Movements: Extraocular movements intact.  Cardiovascular:     Rate and Rhythm: Normal rate and regular rhythm.  Pulmonary:     Effort: Pulmonary effort is normal.     Breath sounds: Normal breath sounds.  Abdominal:     Comments: Surgical sites clean dry intact.  Abdomen soft without guarding  Musculoskeletal:     Comments: No objective swelling at this time.  Some reproducible discomfort to palpation in the right calf muscle.  Neurological:     General: No focal deficit present.     Mental Status: She is oriented to person, place, and time.     Coordination: Coordination normal.  Psychiatric:        Mood and Affect: Mood normal.     ED Results / Procedures / Treatments   Labs (all labs ordered are listed, but only abnormal results are displayed) Labs Reviewed - No data to display  EKG None  Radiology No results found.  Procedures Procedures   Medications Ordered in ED Medications  enoxaparin (LOVENOX) injection 130 mg (has no administration in time range)    ED Course  I have reviewed the triage vital signs and the nursing notes.  Pertinent labs & imaging results that were available during my care of the patient were reviewed by me and considered in my medical decision making (see chart for details).    MDM Rules/Calculators/A&P                         Patient is day 5 postop for hysterectomy.  She developed calf pain and was referred to emergency department with concern for possible DVT.  Clinical exam is normal.  Patient does have some reproducible discomfort in the right calf.  She has not experienced any shortness of breath or chest pain.  At this time, vascular ultrasound is not available.  Will treat with Lovenox with anticipated study tomorrow.  Return precautions reviewed. Final  Clinical Impression(s) / ED Diagnoses Final diagnoses:  Right leg pain    Rx / DC Orders ED Discharge Orders         Ordered    LE VENOUS        04/26/20 2305           Charlesetta Shanks, MD 04/26/20 2311

## 2020-04-26 NOTE — ED Triage Notes (Signed)
Patient is being discharged from the Urgent Care and sent to the Emergency Department via POV. Per provider, patient is in need of higher level of care due to recent surgery and proper diagnostic test unavailable at this location. Patient is aware and verbalizes understanding of plan of care. There were no vitals filed for this visit.

## 2020-04-26 NOTE — ED Triage Notes (Signed)
Pt sent by UC for further evaluation of right calf pain. Pain started this morning. Pt also reports she had a recent hysterectomy.

## 2020-04-27 ENCOUNTER — Emergency Department (HOSPITAL_BASED_OUTPATIENT_CLINIC_OR_DEPARTMENT_OTHER)
Admission: RE | Admit: 2020-04-27 | Discharge: 2020-04-27 | Disposition: A | Discharge status: Home / Self Care | Payer: BC Managed Care – PPO | Source: Ambulatory Visit | Attending: Emergency Medicine | Admitting: Emergency Medicine

## 2020-04-27 DIAGNOSIS — M79604 Pain in right leg: Secondary | ICD-10-CM | POA: Diagnosis not present

## 2020-04-27 NOTE — Telephone Encounter (Signed)
Pt went to ED last evening for right leg pain. Given a Lovenox injection. She had a doppler study this am that was negative for a DVT.

## 2020-04-27 NOTE — Telephone Encounter (Signed)
Told ms Hendricksen that the doppler study was negative for a DVT per Melissa Cross,NP. She can use the 800 mg Ibuprofen with food and apply ice to the right calf. Pt verbalized understanding.

## 2020-04-27 NOTE — Progress Notes (Signed)
Right lower extremity venous study completed.     Please see CV Proc for preliminary results.   Julianah Marciel, RVT  

## 2020-05-11 ENCOUNTER — Encounter: Payer: Self-pay | Admitting: Gynecologic Oncology

## 2020-05-12 ENCOUNTER — Inpatient Hospital Stay: Payer: BC Managed Care – PPO | Attending: Gynecologic Oncology | Admitting: Gynecologic Oncology

## 2020-05-12 ENCOUNTER — Other Ambulatory Visit: Payer: Self-pay

## 2020-05-12 ENCOUNTER — Encounter: Payer: Self-pay | Admitting: Gynecologic Oncology

## 2020-05-12 VITALS — BP 131/79 | HR 62 | Temp 97.5°F | Resp 18 | Wt 191.6 lb

## 2020-05-12 DIAGNOSIS — D271 Benign neoplasm of left ovary: Secondary | ICD-10-CM

## 2020-05-12 DIAGNOSIS — Z90721 Acquired absence of ovaries, unilateral: Secondary | ICD-10-CM | POA: Diagnosis not present

## 2020-05-12 DIAGNOSIS — Z90710 Acquired absence of both cervix and uterus: Secondary | ICD-10-CM | POA: Diagnosis not present

## 2020-05-12 DIAGNOSIS — N939 Abnormal uterine and vaginal bleeding, unspecified: Secondary | ICD-10-CM

## 2020-05-12 DIAGNOSIS — N838 Other noninflammatory disorders of ovary, fallopian tube and broad ligament: Secondary | ICD-10-CM

## 2020-05-12 NOTE — Patient Instructions (Signed)
It is safe to resume all physical activity (including return to work) on 05/19/20. Please avoid intercourse until or after 06/16/20.   Please follow up with Dr Landry Mellow for regular wellness visits.  You still have a right ovary.

## 2020-05-12 NOTE — Progress Notes (Signed)
Follow-up Note: Gyn-Onc  Consult was requested by Dr. Landry Mellow for the evaluation of Diamond Austin 40 y.o. female  CC:  Chief Complaint  Patient presents with  . Ovarian mass  . Abnormal uterine bleeding    Assessment/Plan:  Diamond. Diamond Austin  is a 40 y.o.  year old with a history of a left dermoid cyst who underwent a robotic assisted hysterectomy with left salpingo-oophorectomy and right ovarian cystectomy for a left ovarian mass which was a mature teratoma.   She is doing well postop. Pathology is benign. She will follow-up with Dr Landry Mellow.  HPI: Diamond Austin is a 40 year old P3 who was seen in consultation at the request of Dr Landry Mellow for evaluation of a left ovarian cystic mass and abnormal uterine bleeding.  The patient reported having a change in her normal menstrual cycle since July 2021.  She sought evaluation from her primary care doctor in September 2021 and an ultrasound scan was performed to work this up.  This was performed on 12/31/2019 and revealed a uterus measuring 10.9 x 4.6 x 6.1 cm with a 9 mm endometrial thickness.  The right ovary measured 3.4 x 1.8 x 1.3 cm and was normal.  The left ovary measured 9.1 x 6 x 6.1 cm and contains a large echo genic complex mass measuring 6.7 x 5.4 x 7 cm.  The findings were potential for a dermoid.  She was sent to Dr. Landry Mellow for further evaluation.  Endometrial biopsy was performed by Dr. Landry Mellow on January 15, 2020 and this revealed menstrual type endometrium with separate minute fragments of benign endocervical glands and mucus.  No hyperplasia or carcinoma or endometritis.  Ca1 25 and HD4 were drawn that same day on January 15, 2020 as part of a Roma score.  This resulted with a normal Ca1 25 at 10.1, normal HD4 at 61.9, but a very slightly elevated premenopausal Roma score at 1.15 (upper limit of normal being 1.14).  Interval Hx:  On 04/21/20 she underwent robotic assisted total hysterectomy with left salpingo-oophorectomy and right  ovarian cystectomy. Intraoperative findings were significant for normal-appearing 8 cm uterus, the left ovary was replaced by an 8 cm ovarian cyst which was a dermoid on frozen section, 2 cm right ovarian cyst.  Small adhesion of the omentum to the undersurface of the umbilicus. Surgery was uncomplicated.  Final pathology revealed a benign dermoid cyst of the left ovary, uterus with benign proliferative endometrium, unremarkable cervix, benign follicular cyst of the right ovary.  Since surgery she has done well with no specific complaints.   Current Meds:  Outpatient Encounter Medications as of 05/12/2020  Medication Sig  . calcium-vitamin D (OSCAL WITH D) 250-125 MG-UNIT per tablet Take 1 tablet by mouth daily.  . Multiple Vitamins-Minerals (MULTIVITAMIN WITH MINERALS) tablet Take 1 tablet by mouth daily.  . naproxen (NAPROSYN) 500 MG tablet Take 500 mg by mouth 2 (two) times daily as needed for moderate pain.  Marland Kitchen psyllium (METAMUCIL) 58.6 % packet Take 1 packet by mouth daily.  Marland Kitchen senna-docusate (SENOKOT-S) 8.6-50 MG tablet Take 2 tablets by mouth at bedtime. For AFTER surgery, do not take if having diarrhea  . Vitamin D, Ergocalciferol, (DRISDOL) 1.25 MG (50000 UNIT) CAPS capsule Take 1 capsule by mouth once a week.  . [DISCONTINUED] ibuprofen (ADVIL) 800 MG tablet Take 1 tablet (800 mg total) by mouth every 8 (eight) hours as needed for moderate pain. For AFTER surgery only  . [DISCONTINUED] oxyCODONE (OXY IR/ROXICODONE)  5 MG immediate release tablet Take 1 tablet (5 mg total) by mouth every 4 (four) hours as needed for severe pain. For AFTER surgery only, do not take and drive   No facility-administered encounter medications on file as of 05/12/2020.    Allergy: No Known Allergies  Social Hx:   Social History   Socioeconomic History  . Marital status: Married    Spouse name: Not on file  . Number of children: Not on file  . Years of education: Not on file  . Highest education level:  Not on file  Occupational History  . Not on file  Tobacco Use  . Smoking status: Current Every Day Smoker    Packs/day: 0.25    Years: 8.00    Pack years: 2.00    Types: Cigarettes  . Smokeless tobacco: Never Used  Vaping Use  . Vaping Use: Never used  Substance and Sexual Activity  . Alcohol use: No  . Drug use: No  . Sexual activity: Yes    Birth control/protection: None  Other Topics Concern  . Not on file  Social History Narrative  . Not on file   Social Determinants of Health   Financial Resource Strain: Not on file  Food Insecurity: Not on file  Transportation Needs: Not on file  Physical Activity: Not on file  Stress: Not on file  Social Connections: Not on file  Intimate Partner Violence: Not on file    Past Surgical Hx:  Past Surgical History:  Procedure Laterality Date  . ROBOTIC ASSISTED TOTAL HYSTERECTOMY WITH BILATERAL SALPINGO OOPHERECTOMY N/A 04/21/2020   Procedure: XI ROBOTIC ASSISTED TOTAL HYSTERECTOMY WITH LEFT SALPINGO OOPHORECTOMY. RIGHT SALPINGECTOMY, RIGHT OVARIAN CYSTECTOMY;  Surgeon: Everitt Amber, MD;  Location: [facility];  Service: Gynecology;  Laterality: N/A;  . TUBAL LIGATION Bilateral 04/14/2015   Procedure: POST PARTUM TUBAL LIGATION;  Surgeon: Lavonia Drafts, MD;  Location: [facility];  Service: Gynecology;  Laterality: Bilateral;  . VAGINAL DELIVERY     X 1    Past Medical Hx:  Past Medical History:  Diagnosis Date  . Elevated liver function tests    2011  . Hypercholesteremia   . Pre-diabetes 2019    Past Gynecological History: See HPI Patient's last menstrual period was 03/24/2020.  Family Hx:  Family History  Problem Relation Age of Onset  . Osteoporosis Mother   . Hypertension Father   . Hypertension Brother   . Arthritis Paternal Grandmother     Review of Systems:  Constitutional  Feels well,    ENT Normal appearing ears and nares bilaterally Skin/Breast  No rash, sores, jaundice, itching,  dryness Cardiovascular  No chest pain, shortness of breath, or edema  Pulmonary  No cough or wheeze.  Gastro Intestinal  No nausea, vomitting, or diarrhoea. No bright red blood per rectum, no abdominal pain, change in bowel movement, or constipation.  Genito Urinary  No frequency, urgency, dysuria, increased frequency of menses since July 2021 Musculo Skeletal  No myalgia, arthralgia, joint swelling or pain  Neurologic  No weakness, numbness, change in gait,  Psychology  No depression, anxiety, insomnia.   Vitals:  Blood pressure 131/79, pulse 62, temperature (!) 97.5 F (36.4 C), temperature source Tympanic, resp. rate 18, weight 191 lb 9.6 oz (86.9 kg), last menstrual period 03/24/2020, SpO2 100 %.  Physical Exam: WD in NAD Neck  Supple NROM, without any enlargements.  Lymph Node Survey No cervical supraclavicular or inguinal adenopathy Cardiovascular  Well perfused peripheries Lungs  No increased  WOB Skin  No rash/lesions/breakdown  Psychiatry  Alert and oriented to person, place, and time  Abdomen  Normoactive bowel sounds, abdomen soft, non-tender and obese without evidence of hernia.  Incisions well healed.  Back No CVA tenderness Genito Urinary  Vaginal cuff with very minimal mucosal separation of less than 1 cm in the midline but palpably intact with no defects or dehiscence and no active bleeding. Rectal  Good tone, no masses no cul de sac nodularity.  Extremities  No bilateral cyanosis, clubbing or edema.  Thereasa Solo, MD  05/12/2020, 1:35 PM

## 2020-05-19 ENCOUNTER — Other Ambulatory Visit: Payer: Self-pay

## 2020-05-19 ENCOUNTER — Ambulatory Visit
Admission: RE | Admit: 2020-05-19 | Discharge: 2020-05-19 | Disposition: A | Discharge status: Home / Self Care | Payer: BC Managed Care – PPO | Source: Ambulatory Visit | Attending: Family Medicine | Admitting: Family Medicine

## 2020-05-19 ENCOUNTER — Other Ambulatory Visit: Payer: Self-pay | Admitting: Family Medicine

## 2020-05-19 DIAGNOSIS — R928 Other abnormal and inconclusive findings on diagnostic imaging of breast: Secondary | ICD-10-CM

## 2020-05-19 DIAGNOSIS — R590 Localized enlarged lymph nodes: Secondary | ICD-10-CM | POA: Diagnosis not present

## 2020-08-24 DIAGNOSIS — M25511 Pain in right shoulder: Secondary | ICD-10-CM | POA: Diagnosis not present

## 2020-08-26 ENCOUNTER — Other Ambulatory Visit: Payer: Self-pay | Admitting: Family Medicine

## 2020-08-26 ENCOUNTER — Other Ambulatory Visit: Payer: Self-pay

## 2020-08-26 ENCOUNTER — Ambulatory Visit
Admission: RE | Admit: 2020-08-26 | Discharge: 2020-08-26 | Disposition: A | Discharge status: Home / Self Care | Payer: BC Managed Care – PPO | Source: Ambulatory Visit | Attending: Family Medicine | Admitting: Family Medicine

## 2020-08-26 DIAGNOSIS — R928 Other abnormal and inconclusive findings on diagnostic imaging of breast: Secondary | ICD-10-CM

## 2020-08-26 DIAGNOSIS — R590 Localized enlarged lymph nodes: Secondary | ICD-10-CM | POA: Diagnosis not present

## 2020-09-02 ENCOUNTER — Other Ambulatory Visit: Payer: Self-pay

## 2020-09-02 ENCOUNTER — Other Ambulatory Visit: Payer: Self-pay | Admitting: Family Medicine

## 2020-09-02 ENCOUNTER — Ambulatory Visit
Admission: RE | Admit: 2020-09-02 | Discharge: 2020-09-02 | Disposition: A | Discharge status: Home / Self Care | Payer: BC Managed Care – PPO | Source: Ambulatory Visit | Attending: Family Medicine | Admitting: Family Medicine

## 2020-09-02 DIAGNOSIS — R599 Enlarged lymph nodes, unspecified: Secondary | ICD-10-CM

## 2020-09-02 DIAGNOSIS — R928 Other abnormal and inconclusive findings on diagnostic imaging of breast: Secondary | ICD-10-CM

## 2020-12-06 ENCOUNTER — Other Ambulatory Visit: Payer: BC Managed Care – PPO

## 2020-12-15 ENCOUNTER — Other Ambulatory Visit: Payer: BC Managed Care – PPO

## 2020-12-28 DIAGNOSIS — E781 Pure hyperglyceridemia: Secondary | ICD-10-CM | POA: Diagnosis not present

## 2020-12-28 DIAGNOSIS — R7303 Prediabetes: Secondary | ICD-10-CM | POA: Diagnosis not present

## 2020-12-28 DIAGNOSIS — L659 Nonscarring hair loss, unspecified: Secondary | ICD-10-CM | POA: Diagnosis not present

## 2020-12-28 DIAGNOSIS — Z0000 Encounter for general adult medical examination without abnormal findings: Secondary | ICD-10-CM | POA: Diagnosis not present

## 2020-12-28 DIAGNOSIS — E559 Vitamin D deficiency, unspecified: Secondary | ICD-10-CM | POA: Diagnosis not present

## 2020-12-28 DIAGNOSIS — Z23 Encounter for immunization: Secondary | ICD-10-CM | POA: Diagnosis not present

## 2020-12-30 ENCOUNTER — Other Ambulatory Visit: Payer: Self-pay

## 2020-12-30 ENCOUNTER — Ambulatory Visit
Admission: RE | Admit: 2020-12-30 | Discharge: 2020-12-30 | Disposition: A | Discharge status: Home / Self Care | Payer: BC Managed Care – PPO | Source: Ambulatory Visit | Attending: Family Medicine | Admitting: Family Medicine

## 2020-12-30 DIAGNOSIS — R590 Localized enlarged lymph nodes: Secondary | ICD-10-CM | POA: Diagnosis not present

## 2020-12-30 DIAGNOSIS — R599 Enlarged lymph nodes, unspecified: Secondary | ICD-10-CM

## 2021-01-05 DIAGNOSIS — M25511 Pain in right shoulder: Secondary | ICD-10-CM | POA: Diagnosis not present

## 2021-01-24 ENCOUNTER — Other Ambulatory Visit: Payer: Self-pay | Admitting: Sports Medicine

## 2021-01-24 DIAGNOSIS — M25511 Pain in right shoulder: Secondary | ICD-10-CM

## 2021-02-11 ENCOUNTER — Other Ambulatory Visit: Payer: BC Managed Care – PPO

## 2021-02-17 ENCOUNTER — Other Ambulatory Visit: Payer: Self-pay

## 2021-02-17 ENCOUNTER — Ambulatory Visit
Admission: RE | Admit: 2021-02-17 | Discharge: 2021-02-17 | Disposition: A | Discharge status: Home / Self Care | Payer: BC Managed Care – PPO | Source: Ambulatory Visit | Attending: Sports Medicine | Admitting: Sports Medicine

## 2021-02-17 DIAGNOSIS — M25511 Pain in right shoulder: Secondary | ICD-10-CM

## 2021-03-03 ENCOUNTER — Other Ambulatory Visit: Payer: Self-pay | Admitting: Family Medicine

## 2021-03-03 DIAGNOSIS — Z1231 Encounter for screening mammogram for malignant neoplasm of breast: Secondary | ICD-10-CM

## 2021-03-06 ENCOUNTER — Inpatient Hospital Stay (HOSPITAL_COMMUNITY): Admission: RE | Admit: 2021-03-06 | Payer: BC Managed Care – PPO | Source: Ambulatory Visit

## 2021-03-06 DIAGNOSIS — Z1231 Encounter for screening mammogram for malignant neoplasm of breast: Secondary | ICD-10-CM

## 2021-03-20 ENCOUNTER — Ambulatory Visit
Admission: RE | Admit: 2021-03-20 | Discharge: 2021-03-20 | Disposition: A | Discharge status: Home / Self Care | Payer: BC Managed Care – PPO | Source: Ambulatory Visit

## 2021-03-20 DIAGNOSIS — Z1231 Encounter for screening mammogram for malignant neoplasm of breast: Secondary | ICD-10-CM

## 2021-07-25 DIAGNOSIS — Z713 Dietary counseling and surveillance: Secondary | ICD-10-CM | POA: Diagnosis not present

## 2021-12-17 DIAGNOSIS — Z713 Dietary counseling and surveillance: Secondary | ICD-10-CM | POA: Diagnosis not present

## 2022-01-05 DIAGNOSIS — Z23 Encounter for immunization: Secondary | ICD-10-CM | POA: Diagnosis not present

## 2022-01-05 DIAGNOSIS — E559 Vitamin D deficiency, unspecified: Secondary | ICD-10-CM | POA: Diagnosis not present

## 2022-01-05 DIAGNOSIS — R7303 Prediabetes: Secondary | ICD-10-CM | POA: Diagnosis not present

## 2022-01-05 DIAGNOSIS — Z0000 Encounter for general adult medical examination without abnormal findings: Secondary | ICD-10-CM | POA: Diagnosis not present

## 2022-04-12 DIAGNOSIS — E781 Pure hyperglyceridemia: Secondary | ICD-10-CM | POA: Diagnosis not present

## 2022-04-12 DIAGNOSIS — R5383 Other fatigue: Secondary | ICD-10-CM | POA: Diagnosis not present

## 2022-04-12 DIAGNOSIS — Z1389 Encounter for screening for other disorder: Secondary | ICD-10-CM | POA: Diagnosis not present

## 2022-04-12 DIAGNOSIS — E8889 Other specified metabolic disorders: Secondary | ICD-10-CM | POA: Diagnosis not present

## 2022-04-12 DIAGNOSIS — E559 Vitamin D deficiency, unspecified: Secondary | ICD-10-CM | POA: Diagnosis not present

## 2022-04-12 DIAGNOSIS — R7303 Prediabetes: Secondary | ICD-10-CM | POA: Diagnosis not present

## 2022-04-26 DIAGNOSIS — E559 Vitamin D deficiency, unspecified: Secondary | ICD-10-CM | POA: Diagnosis not present

## 2022-04-26 DIAGNOSIS — R7303 Prediabetes: Secondary | ICD-10-CM | POA: Diagnosis not present

## 2022-05-10 DIAGNOSIS — Z9189 Other specified personal risk factors, not elsewhere classified: Secondary | ICD-10-CM | POA: Diagnosis not present

## 2022-05-10 DIAGNOSIS — R7303 Prediabetes: Secondary | ICD-10-CM | POA: Diagnosis not present

## 2022-05-10 DIAGNOSIS — E559 Vitamin D deficiency, unspecified: Secondary | ICD-10-CM | POA: Diagnosis not present

## 2022-06-21 DIAGNOSIS — R7303 Prediabetes: Secondary | ICD-10-CM | POA: Diagnosis not present

## 2022-06-21 DIAGNOSIS — Z6835 Body mass index (BMI) 35.0-35.9, adult: Secondary | ICD-10-CM | POA: Diagnosis not present

## 2022-08-09 DIAGNOSIS — R7303 Prediabetes: Secondary | ICD-10-CM | POA: Diagnosis not present

## 2022-09-27 IMAGING — MG MM DIGITAL SCREENING BILAT W/ TOMO AND CAD
8 series · 8 of 24 positions shown · non-contrast
Comparison: Previous exam(s).

CLINICAL DATA: Screening.

EXAM:
DIGITAL SCREENING BILATERAL MAMMOGRAM WITH TOMOSYNTHESIS AND CAD
TECHNIQUE: Bilateral screening digital craniocaudal and mediolateral oblique
mammograms were obtained. Bilateral screening digital breast
tomosynthesis was performed. The images were evaluated with
computer-aided detection.

[R CC synth-2D]
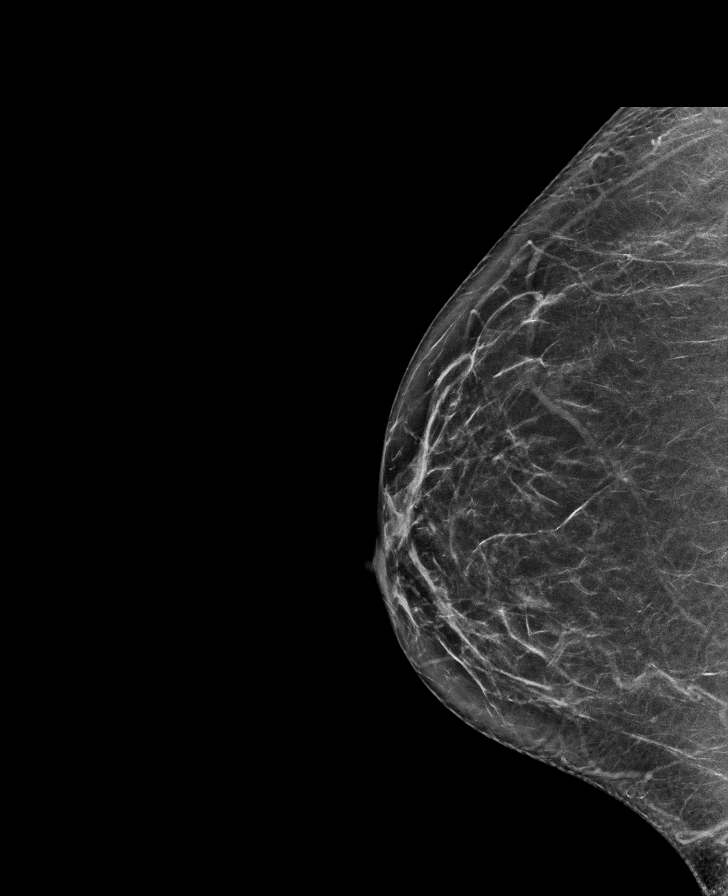

[R MLO synth-2D]
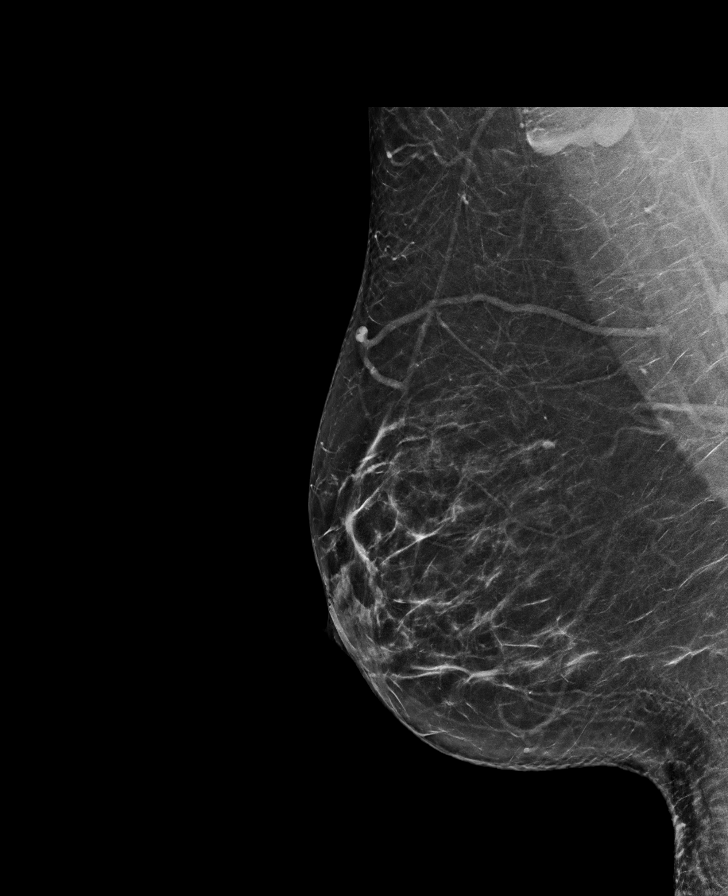

[L CC synth-2D]
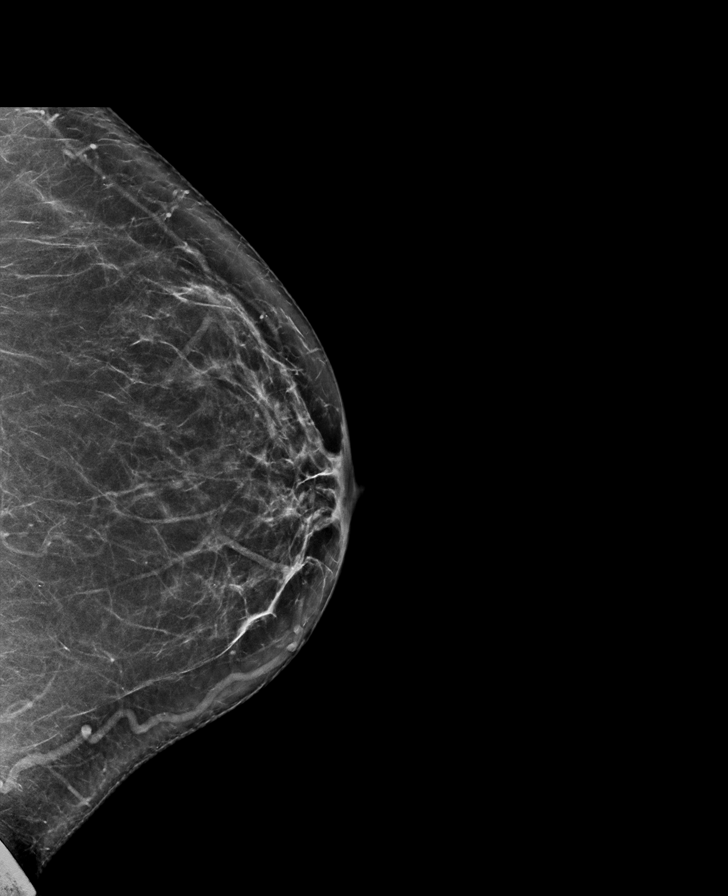

[L MLO synth-2D]
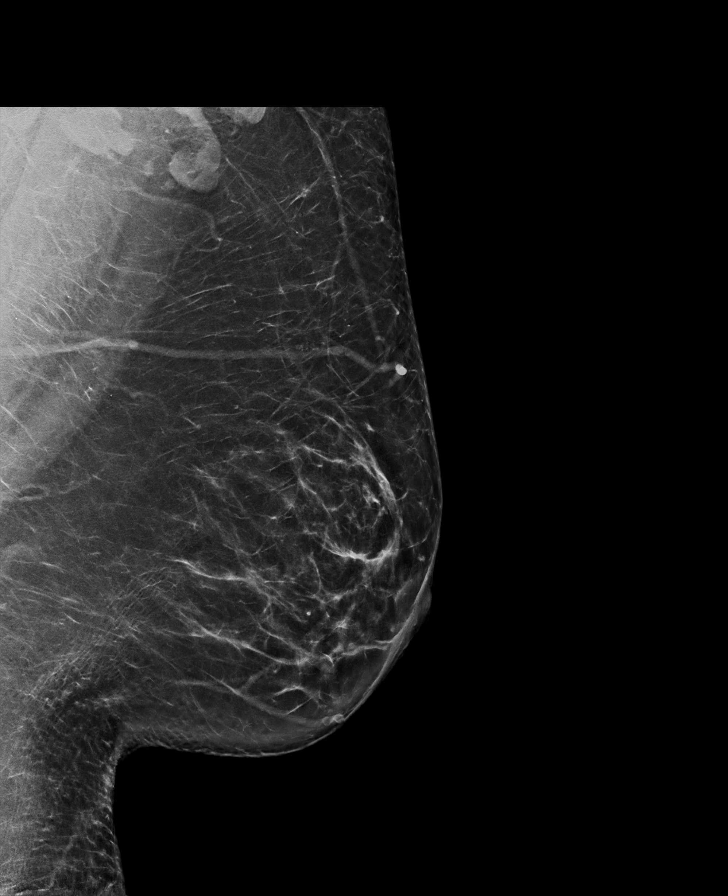

[R CC tomo · tomo slice 37/72.0]
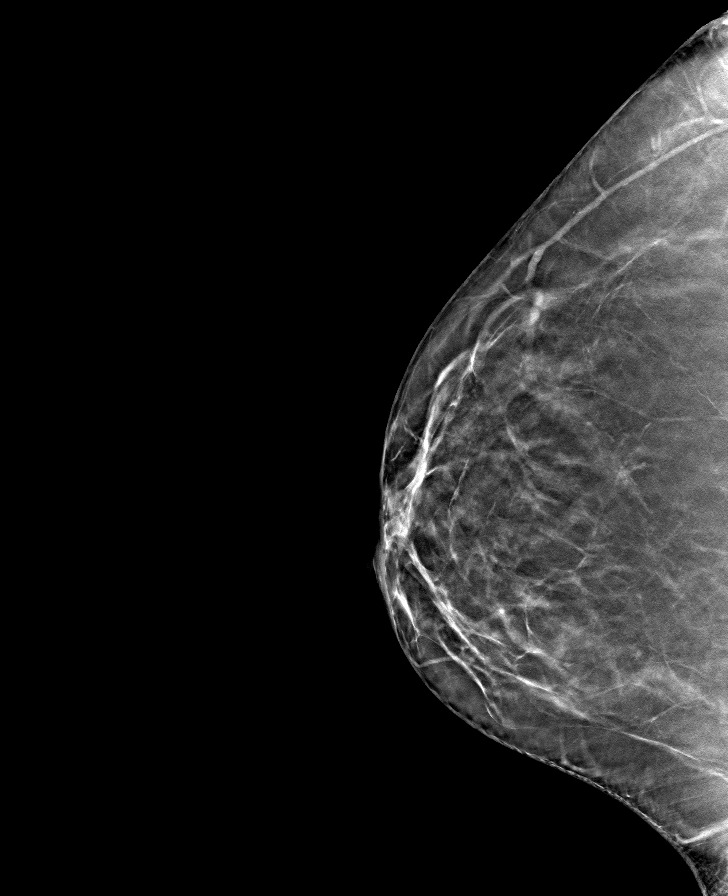

[L CC tomo · tomo slice 37/73.0]
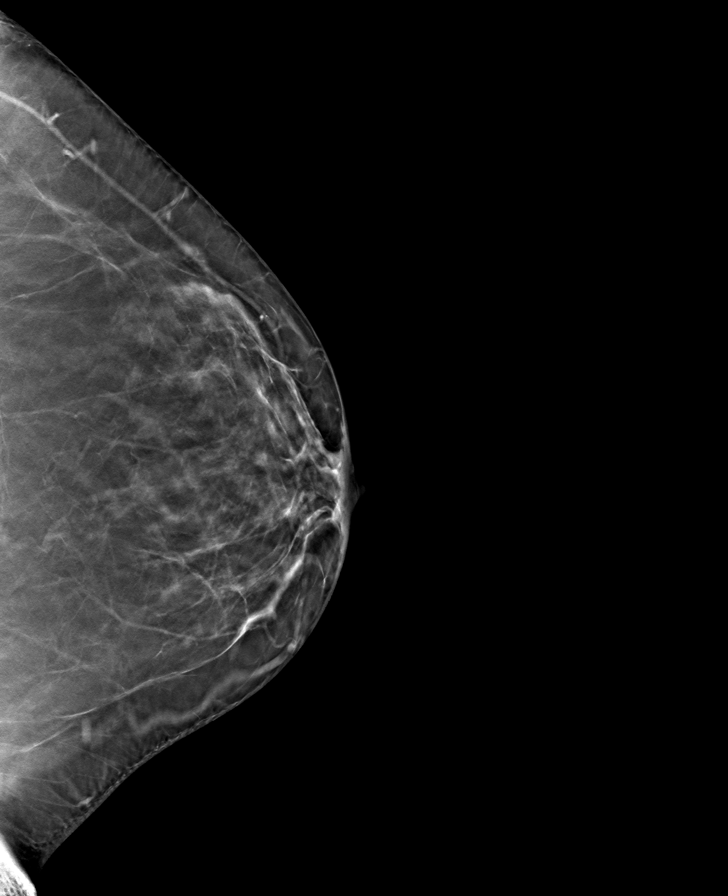

[L MLO tomo · tomo slice 45/89.0]
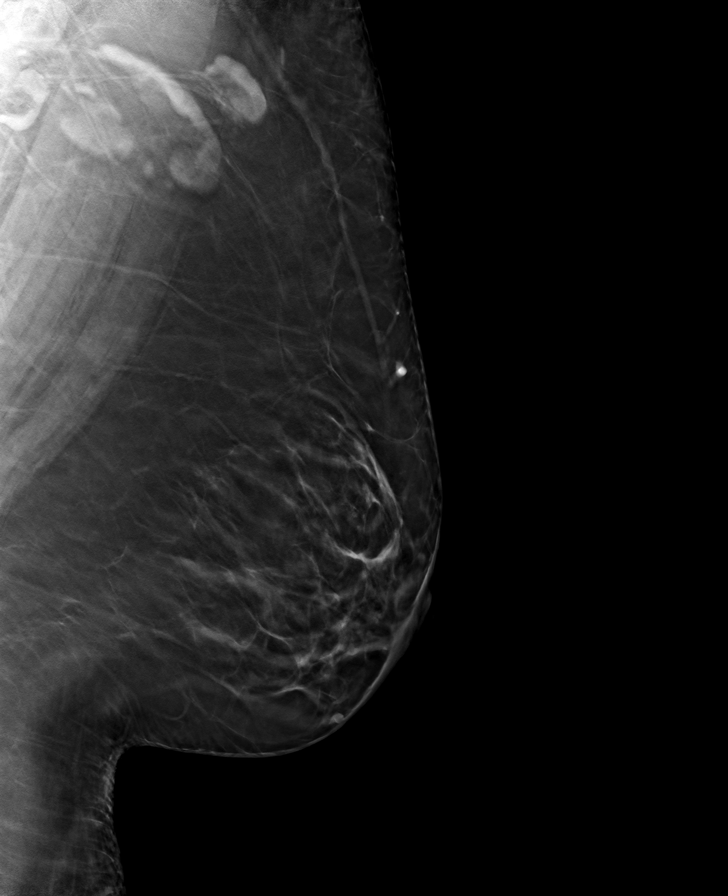

[R MLO tomo · tomo slice 42/83.0]
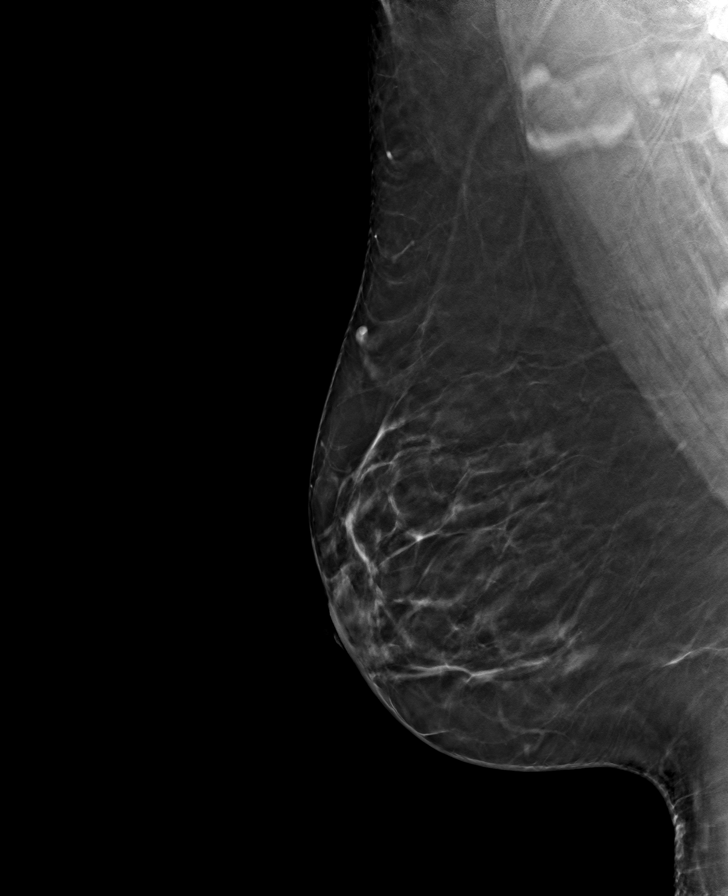

[8 of 24 positions shown; findings below may reference images not displayed]

ACR Breast Density Category b: There are scattered areas of
fibroglandular density.
FINDINGS: There are no findings suspicious for malignancy.
IMPRESSION: No mammographic evidence of malignancy. A result letter of this
screening mammogram will be mailed directly to the patient.

RECOMMENDATION:
Screening mammogram in one year. (Code:51-O-LD2)

BI-RADS CATEGORY  1: Negative.

## 2023-01-11 ENCOUNTER — Other Ambulatory Visit: Payer: Self-pay | Admitting: Family Medicine

## 2023-01-11 DIAGNOSIS — Z1231 Encounter for screening mammogram for malignant neoplasm of breast: Secondary | ICD-10-CM

## 2023-01-11 DIAGNOSIS — Z0000 Encounter for general adult medical examination without abnormal findings: Secondary | ICD-10-CM | POA: Diagnosis not present

## 2023-01-11 DIAGNOSIS — Z23 Encounter for immunization: Secondary | ICD-10-CM | POA: Diagnosis not present

## 2023-01-11 DIAGNOSIS — R7303 Prediabetes: Secondary | ICD-10-CM | POA: Diagnosis not present

## 2023-01-11 DIAGNOSIS — E559 Vitamin D deficiency, unspecified: Secondary | ICD-10-CM | POA: Diagnosis not present

## 2023-01-25 ENCOUNTER — Ambulatory Visit
Admission: RE | Admit: 2023-01-25 | Discharge: 2023-01-25 | Disposition: A | Discharge status: Home / Self Care | Payer: BC Managed Care – PPO | Source: Ambulatory Visit | Attending: Family Medicine | Admitting: Family Medicine

## 2023-01-25 DIAGNOSIS — Z1231 Encounter for screening mammogram for malignant neoplasm of breast: Secondary | ICD-10-CM | POA: Diagnosis not present

## 2023-02-08 ENCOUNTER — Other Ambulatory Visit: Payer: Self-pay | Admitting: Family Medicine

## 2023-02-08 DIAGNOSIS — Z1231 Encounter for screening mammogram for malignant neoplasm of breast: Secondary | ICD-10-CM

## 2024-01-22 DIAGNOSIS — E559 Vitamin D deficiency, unspecified: Secondary | ICD-10-CM | POA: Diagnosis not present

## 2024-01-22 DIAGNOSIS — R7303 Prediabetes: Secondary | ICD-10-CM | POA: Diagnosis not present

## 2024-01-22 DIAGNOSIS — Z23 Encounter for immunization: Secondary | ICD-10-CM | POA: Diagnosis not present

## 2024-01-22 DIAGNOSIS — Z0000 Encounter for general adult medical examination without abnormal findings: Secondary | ICD-10-CM | POA: Diagnosis not present

## 2024-01-22 DIAGNOSIS — E782 Mixed hyperlipidemia: Secondary | ICD-10-CM | POA: Diagnosis not present

## 2024-01-31 ENCOUNTER — Ambulatory Visit
Admission: RE | Admit: 2024-01-31 | Discharge: 2024-01-31 | Disposition: A | Discharge status: Home / Self Care | Payer: BC Managed Care – PPO | Source: Ambulatory Visit | Attending: Family Medicine | Admitting: Family Medicine

## 2024-01-31 DIAGNOSIS — Z1231 Encounter for screening mammogram for malignant neoplasm of breast: Secondary | ICD-10-CM | POA: Diagnosis not present

## 2024-03-16 DIAGNOSIS — Z1211 Encounter for screening for malignant neoplasm of colon: Secondary | ICD-10-CM | POA: Diagnosis not present

## 2024-03-16 DIAGNOSIS — D128 Benign neoplasm of rectum: Secondary | ICD-10-CM | POA: Diagnosis not present

## 2024-03-16 DIAGNOSIS — K648 Other hemorrhoids: Secondary | ICD-10-CM | POA: Diagnosis not present

## 2024-03-16 DIAGNOSIS — K5730 Diverticulosis of large intestine without perforation or abscess without bleeding: Secondary | ICD-10-CM | POA: Diagnosis not present
